# Patient Record
Sex: Male | Born: 1955 | Race: White | Hispanic: No | Marital: Married | Smoking: Former smoker
Health system: Southern US, Community
[De-identification: ages and names within clinical notes are randomized; demographics above are authoritative.]

## PROBLEM LIST (undated history)

## (undated) DIAGNOSIS — J42 Unspecified chronic bronchitis: Secondary | ICD-10-CM

## (undated) DIAGNOSIS — J84112 Idiopathic pulmonary fibrosis: Secondary | ICD-10-CM

## (undated) DIAGNOSIS — I209 Angina pectoris, unspecified: Secondary | ICD-10-CM

## (undated) DIAGNOSIS — M5136 Other intervertebral disc degeneration, lumbar region: Secondary | ICD-10-CM

## (undated) DIAGNOSIS — R7989 Other specified abnormal findings of blood chemistry: Secondary | ICD-10-CM

## (undated) DIAGNOSIS — Z789 Other specified health status: Secondary | ICD-10-CM

## (undated) DIAGNOSIS — I10 Essential (primary) hypertension: Secondary | ICD-10-CM

## (undated) DIAGNOSIS — K219 Gastro-esophageal reflux disease without esophagitis: Secondary | ICD-10-CM

## (undated) DIAGNOSIS — R778 Other specified abnormalities of plasma proteins: Secondary | ICD-10-CM

## (undated) DIAGNOSIS — M47816 Spondylosis without myelopathy or radiculopathy, lumbar region: Secondary | ICD-10-CM

## (undated) DIAGNOSIS — G4730 Sleep apnea, unspecified: Secondary | ICD-10-CM

## (undated) DIAGNOSIS — M792 Neuralgia and neuritis, unspecified: Secondary | ICD-10-CM

## (undated) DIAGNOSIS — G4700 Insomnia, unspecified: Secondary | ICD-10-CM

## (undated) DIAGNOSIS — R05 Cough: Secondary | ICD-10-CM

## (undated) DIAGNOSIS — M5442 Lumbago with sciatica, left side: Secondary | ICD-10-CM

## (undated) DIAGNOSIS — E785 Hyperlipidemia, unspecified: Secondary | ICD-10-CM

## (undated) DIAGNOSIS — I639 Cerebral infarction, unspecified: Secondary | ICD-10-CM

## (undated) DIAGNOSIS — E6609 Other obesity due to excess calories: Secondary | ICD-10-CM

## (undated) DIAGNOSIS — R079 Chest pain, unspecified: Secondary | ICD-10-CM

## (undated) DIAGNOSIS — R053 Chronic cough: Secondary | ICD-10-CM

## (undated) HISTORY — DX: Essential (primary) hypertension: I10

## (undated) HISTORY — DX: Other specified abnormalities of plasma proteins: R77.8

## (undated) HISTORY — DX: Other obesity due to excess calories: E66.09

## (undated) HISTORY — DX: Idiopathic pulmonary fibrosis: J84.112

## (undated) HISTORY — DX: Lumbago with sciatica, left side: M54.42

## (undated) HISTORY — DX: Cerebral infarction, unspecified: I63.9

## (undated) HISTORY — DX: Cough: R05

## (undated) HISTORY — DX: Chronic cough: R05.3

## (undated) HISTORY — DX: Sleep apnea, unspecified: G47.30

## (undated) HISTORY — DX: Unspecified chronic bronchitis: J42

## (undated) HISTORY — DX: Insomnia, unspecified: G47.00

## (undated) HISTORY — DX: Neuralgia and neuritis, unspecified: M79.2

## (undated) HISTORY — DX: Other intervertebral disc degeneration, lumbar region: M51.36

## (undated) HISTORY — PX: OTHER SURGICAL HISTORY: SHX169

## (undated) HISTORY — DX: Chest pain, unspecified: R07.9

## (undated) HISTORY — DX: Gastro-esophageal reflux disease without esophagitis: K21.9

## (undated) HISTORY — DX: Other specified abnormal findings of blood chemistry: R79.89

## (undated) HISTORY — DX: Spondylosis without myelopathy or radiculopathy, lumbar region: M47.816

## (undated) HISTORY — DX: Angina pectoris, unspecified: I20.9

## (undated) HISTORY — DX: Hyperlipidemia, unspecified: E78.5

## (undated) HISTORY — DX: Other specified health status: Z78.9

---

## 2017-07-22 ENCOUNTER — Ambulatory Visit (INDEPENDENT_AMBULATORY_CARE_PROVIDER_SITE_OTHER): Payer: Medicare HMO | Admitting: Internal Medicine

## 2017-07-22 ENCOUNTER — Other Ambulatory Visit: Payer: Medicare HMO

## 2017-07-22 ENCOUNTER — Encounter: Payer: Self-pay | Admitting: Internal Medicine

## 2017-07-22 VITALS — BP 130/70 | HR 82 | Ht 70.0 in | Wt 226.4 lb

## 2017-07-22 DIAGNOSIS — Z79899 Other long term (current) drug therapy: Secondary | ICD-10-CM

## 2017-07-22 DIAGNOSIS — J849 Interstitial pulmonary disease, unspecified: Secondary | ICD-10-CM | POA: Diagnosis not present

## 2017-07-22 DIAGNOSIS — Z01811 Encounter for preprocedural respiratory examination: Secondary | ICD-10-CM | POA: Insufficient documentation

## 2017-07-22 NOTE — Progress Notes (Signed)
   Subjective:    Patient ID: Chase Kim, male    DOB: 09-14-1956, 61 y.o.   MRN: [id number]  HPI    Review of Systems  Constitutional: Positive for unexpected weight change. Negative for fever.  HENT: Positive for ear discharge. Negative for congestion, dental problem, nosebleeds, postnasal drip, rhinorrhea, sinus pressure, sneezing, sore throat and trouble swallowing.   Eyes: Negative for redness and itching.  Respiratory: Positive for cough, chest tightness, shortness of breath and wheezing.   Cardiovascular: Positive for palpitations and leg swelling.  Gastrointestinal: Negative for nausea and vomiting.  Genitourinary: Negative for dysuria.  Musculoskeletal: Negative for joint swelling.  Skin: Negative for rash.  Allergic/Immunologic: Negative.  Negative for environmental allergies, food allergies and immunocompromised state.  Neurological: Negative for headaches.  Hematological: Bruises/bleeds easily.  Psychiatric/Behavioral: Positive for dysphoric mood. The patient is nervous/anxious.        Objective:   Physical Exam        Assessment & Plan:

## 2017-07-22 NOTE — Progress Notes (Addendum)
Subjective:     Patient ID: Chase Kim, male   DOB: October 08, 1955, 61 y.o.   MRN: [id number] PCP Charlott Rakes, MD  HPI   IOV 07/22/2017  Chief Complaint  Patient presents with  . Advice Only    Referred by Marzetta Board from [place] for interstitial pulmonary fibrosis.  C/o prod. cough with white to dark brown with bloody mucus, SOB on exertion, and CP. 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 He tells me that he said insidious onset of cough for the last few years particularly worse in the last 1 year. Approximately 1 year ago he ended up getting a second vaccine of Prevnar 23 at [facility]. Apparently after this the cough started getting worse since then it is been progressive also associated with progressive shortness of breath. The cough was getting worse despitestopping ace inhibitors. Then in February2018 he had the CT scan of the chest.  cT scan of the chest without contrast done at [facility]: First of all it is unclear to me if this is a high-resolution CT scan of the chest. The report does not talk about UIP or possible UIP.the CT scan talks about "subpleural reticular opacities and scattered mild subpleural cystic changes throughout both lungs favored to represent chronic fibrosis. In additionthis reticular linear opacities involving the anterior portions of the lower lobes and lingula  And lateral left upper lobe favored to represent chronic scarring, atelectasis or fibrosis." ,. Review of the [facility] notes indicate that on 11/29/2016 Dr Kathi Ludwig did notice the presence of emphysema on the above changes and also the absence of honeycombing and use the CT scan to diagnose IPF. HIs noted July 2018 specifically states that he felt patient had bilateral  subpleural reticulation without groundglass opacitiesdistributed in  all the lobes. He also thought there was some subpleural honeycomb changing. He felt the CT scan was classic for UIP.By this time patient had seen rheumatologist as I confirmed that on the [facility] notes A diagnosis of IPF was established. Patient was started on Pirfenidone (Esbriet). He completed 8 weeks of this at this point and he is tolerating it fine. LAst LFT Sep 2018   His pulmonary function test on 06/18/2017 shows FVC 3.77 L/79.6% predicted, total lung capacity of 5.34 L/76.3% predicted and DLCO 13.15/38.4% predicted[uncorrected DLCO for hemoglobin]  Walking desaturation test 185 feet 3 laps on room air today: Resting pulse ox 99%. Final pulse ox 94%. Resting heart rate 81/m. Final heart rate 108/m   Autoimmune lab work shows 03/03/2017 CCP less than 5but his rheumatoid factor on the same day was elevated at 80  Other lab work shows an elevation in creatinine on 05/31/2007 1.4 mg percent but is in June it was 1.25 mg percent. Liver function test normal as of September 2018 He is also little bit anemic with 05/30/2017 hemoglobin 12.8 g percent which is slightly down compared to 13.4 g percent in May 2017.nuclear medicine cardiac stress test shows ejection fraction 52% with normal perfusion [facility] May    He is here with his grandson both of them extremely worried about the cough and life expectancy. Asking about research trials.in particular they've done some research and stem cell trials. We discussed this extensively.   Also gives a history of sleep apnea diagnosed several years  ago but insurance did not cover his CPAP she is not using it. He tells me that he does have nocturnal desaturations as tested by his wife at home  [employer] of chest physicians interstitial lung disease questionnaire :: filled and returned the following day and entered by me on 08/01/2017  - Symptoms: cough - moste days +, severe , night +, mucus +, hemotpysis +, x 1.5 years.  Dyspnea - clas 3 - Pmhx - heard dz, stroke,   - ROS: GERD +, dry skin +, arthralgia + - Personal exposure: did used street drugs. Smoked cig age [age], 2ppd - Fam hx: COPD +, rest negative - Home - > 39 years old, There is water damage, mold, animals - bids - Occupation:  Worked for [employer],. X 16.5 years. Dust +, Insulation +, Smoke +, Crawlspace_. Also did work in [employer], Advertising account planner, Marine scientist, asbestos, potery, dtergent, Psychologist, forensic, brakes, malt, - Pneumptox: negative   has a past medical history of Angina pectoris ([place]); Bilateral low back pain with left-sided sciatica; Bronchitis, chronic ([place]); Chest pain; Chronic bronchitis ([place]); Chronic cough; DDD (degenerative disc disease), lumbar; Elevated troponin; GERD (gastroesophageal reflux disease); Hyperlipidemia; Hypertension; Insomnia; IPF (idiopathic pulmonary fibrosis) ([employer]); Lumbar facet arthropathy; Neuropathic pain; Non morbid obesity due to excess calories; Poor tolerance for activity; Sleep apnea; and Stroke ([place]).   reports that he quit smoking about 15 years ago. His smoking use included Cigarettes. He has a 54.00 pack-year smoking history. He has never used smokeless tobacco.  Past Surgical History:  Procedure Laterality Date  . triple bypass surgery      Allergies  Allergen Reactions  . Penicillins Swelling  . Benadryl [Diphenhydramine] Hives     There is no immunization history on file for this patient.  Family History  Problem Relation Age of Onset  . Alzheimer's disease Mother   . Leukemia Father   . Lung cancer Sister      Current Outpatient Prescriptions:  .  albuterol (PROVENTIL HFA;VENTOLIN HFA) 108 (90 Base) MCG/ACT inhaler, Inhale into the lungs., Disp: , Rfl:  .  cyclobenzaprine (FLEXERIL) 10 MG tablet, Take by mouth., Disp: , Rfl:  .  esomeprazole (NEXIUM) 40 MG capsule, Take 40 mg by mouth., Disp: , Rfl:  .  ketoconazole (NIZORAL) 2 % shampoo, Apply topically., Disp: , Rfl:   .  Pirfenidone 267 MG CAPS, Take 801 mg by mouth., Disp: , Rfl:  .  ranolazine (RANEXA) 500 MG 12 hr tablet, Take 500 mg by mouth., Disp: , Rfl:  .  rosuvastatin (CRESTOR) 20 MG tablet, Take 20 mg by mouth., Disp: , Rfl:  .  aspirin EC 325 MG tablet, Take 325 mg by mouth., Disp: , Rfl:  .  benzonatate (TESSALON) 100 MG capsule, TAKE 1 CAPSULE BY MOUTH EVERY 8 HOURS, Disp: , Rfl: 0 .  budesonide-formoterol (SYMBICORT) 80-4.5 MCG/ACT inhaler, Inhale into the lungs., Disp: , Rfl:  .  clopidogrel (PLAVIX) 75 MG tablet, clopidogrel 75 mg tablet, Disp: , Rfl:  .  furosemide (LASIX) 40 MG tablet, , Disp: , Rfl:  .  gabapentin (NEURONTIN) 400 MG capsule, , Disp: , Rfl:  .  HYDROcodone-acetaminophen (NORCO/VICODIN) 5-325 MG tablet, hydrocodone 5 mg-acetaminophen 325 mg tablet, Disp: , Rfl:  .  isosorbide mononitrate (IMDUR) 30 MG 24 hr tablet, , Disp: , Rfl:  .  metoprolol succinate (TOPROL-XL) 50 MG 24 hr tablet, , Disp: , Rfl:  .  nitroGLYCERIN (NITROSTAT) 0.4 MG SL tablet, , Disp: ,  Rfl:  .  Pirfenidone (ESBRIET) 267 MG TABS, Esbriet 267 mg tablet, Disp: , Rfl:  .  polyethylene glycol (MIRALAX / GLYCOLAX) packet, Take 17 g by mouth., Disp: , Rfl:  .  traZODone (DESYREL) 100 MG tablet, Take 100 mg by mouth., Disp: , Rfl:  .  zolpidem (AMBIEN) 10 MG tablet, zolpidem 10 mg tablet, Disp: , Rfl:     Review of Systems     Objective:   Physical Exam  Constitutional: He is oriented to person, place, and time. He appears well-developed and well-nourished. No distress.  HENT:  Head: Normocephalic and atraumatic.  Right Ear: External ear normal.  Left Ear: External ear normal.  Mouth/Throat: Oropharynx is clear and moist. No oropharyngeal exudate.  Eyes: Pupils are equal, round, and reactive to light. Conjunctivae and EOM are normal. Right eye exhibits no discharge. Left eye exhibits no discharge. No scleral icterus.  Neck: Normal range of motion. Neck supple. No JVD present. No tracheal deviation  present. No thyromegaly present.  Cardiovascular: Normal rate, regular rhythm and intact distal pulses.  Exam reveals no gallop and no friction rub.   No murmur heard. Pulmonary/Chest: Effort normal. No respiratory distress. He has no wheezes. He has rales. He exhibits no tenderness.  Mild bilateral crackles present hard to hear  Abdominal: Soft. Bowel sounds are normal. He exhibits no distension and no mass. There is no tenderness. There is no rebound and no guarding.  Visceral  obesity  Musculoskeletal: Normal range of motion. He exhibits no edema or tenderness.  Clubbing present  Lymphadenopathy:    He has no cervical adenopathy.  Neurological: He is alert and oriented to person, place, and time. He has normal reflexes. No cranial nerve deficit. Coordination normal.  Skin: Skin is warm and dry. No rash noted. He is not diaphoretic. No erythema. No pallor.  Psychiatric: He has a normal mood and affect. His behavior is normal. Judgment and thought content normal.  Nursing note and vitals reviewed.  Vitals:   07/22/17 1633  BP: 130/70  Pulse: 82  SpO2: 92%  Weight: 226 lb 6.4 oz (102.7 kg)  Height: '5\' 10"'  (1.778 m)    Estimated body mass index is 32.49 kg/m as calculated from the following:   Height as of this encounter: '5\' 10"'  (1.778 m).   Weight as of this encounter: 226 lb 6.4 oz (102.7 kg).     Assessment:       ICD-10-CM   1. ILD (interstitial lung disease) (HCC) J84.9 Hepatic function panel    Sed Rate (ESR)    Angiotensin converting enzyme    Antinuclear Antib (ANA)    Anti-DNA antibody, double-stranded    Rheumatoid factor    Cyclic citrul peptide antibody, IgG    Sjogren's syndrome antibods(ssa + ssb)    Anti-scleroderma antibody    ANCA Screen Reflex Titer    Mpo/pr-3 (anca) antibodies    CK Total (and CKMB)    RNP Antibodies    Aldolase    Hypersensitivity pnuemonitis profile    Pulse oximetry, overnight  2. Encounter for drug therapy Z79.899   3.  Interstitial pulmonary disease (HCC) J84.9 CT Chest High Resolution      Plan:      We discussed   Re IPF - Course   - progressive disease in almost all patients (> 90%); typically few to several year progression   - super unlukcy 10% - progress to death in a year   - super lucky < 10% -  stability > 5 years or even rarely 10 years   -unpredictable in each individual - Rx:  anti-fibrotics + since 2014 but they can only slow disease down; preventative. Not Rx symptoms  - they have side effects including intolerance is < 1/3rd patients  - most 55-65% patients tolerate them well  - further details below - Other pillars of management  - Symptoms - cough and dyspnea RX -> will address at followu  - O2 - definitely helps symptoms -. Test for ONO  - Rehab - definitely helps symptoms and conditioning -> will address more at fo  -  Pulmonary Trials: highly encourage participation through [employer]: discussed concept of trials and discussed stem cell trials in this contex  - Patient Support Group  - to be disussed in future   - http://[place].com/ \   - https://smith-[place].net/   Anti-fibrotic Drugs Both drugs OFEV and Esbriet only slow down progression, 1 out of 6 patients  - this means extension in quality of life but no difference in symptoms  - no study directly compares the 2 drugs but efficacy roughly equal at 1 year time point  - ESBRIET  - 3 pill three times daily, slow titration.  - Need to wean sunscreen  - Some chance of nausea and anorexia with small chance for diarrhea  - no known heart attack risk - no known bleeding risk,   - need monthly blood work for 6 months - monitor liver function - possible mortality benefit in pooled analysis  - larger world wide experience   > 50% of this > 40 min visit spent in face to face counseling or/and  coordination of care  At followup discuss HP exposure  Dr. Brand Males, M.D., [facility].C.P Pulmonary and Critical Care Medicine Staff Physician [place] [facility] Pager: [phone], If no answer or between  15:00h - 7:00h: call [phone]  07/22/2017 5:46 PM

## 2017-07-22 NOTE — Patient Instructions (Addendum)
ICD-10-CM   1. ILD (interstitial lung disease) (HCC) J84.9     Do Serum: ESR, ACE, ANA, DS-DNA, RF, anti-CCP, ssA, ssB, scl-70, ANCA screen, MPO, PR-3, Total CK,  RNP, Aldolase,  Hypersensitivity Pneumonitis Panel and LFT  Do HRCT  Do ONO test on room air  Do ACCP ILD Questionnaire - you can take it home and drop it off sometime or bring it next visit  Followup  - next few weeks but after completing above; we will discuss ILD in more detail esp cough

## 2017-07-23 ENCOUNTER — Other Ambulatory Visit (INDEPENDENT_AMBULATORY_CARE_PROVIDER_SITE_OTHER): Payer: Medicare HMO

## 2017-07-23 ENCOUNTER — Telehealth: Payer: Self-pay | Admitting: Internal Medicine

## 2017-07-23 DIAGNOSIS — J849 Interstitial pulmonary disease, unspecified: Secondary | ICD-10-CM | POA: Diagnosis not present

## 2017-07-23 LAB — HEPATIC FUNCTION PANEL
ALK PHOS: 51 U/L (ref 39–117)
ALT: 13 U/L (ref 0–53)
AST: 16 U/L (ref 0–37)
Albumin: 3.6 g/dL (ref 3.5–5.2)
BILIRUBIN TOTAL: 0.5 mg/dL (ref 0.2–1.2)
Bilirubin, Direct: 0.1 mg/dL (ref 0.0–0.3)
Total Protein: 6.5 g/dL (ref 6.0–8.3)

## 2017-07-23 LAB — SEDIMENTATION RATE: Sed Rate: 48 mm/hr — ABNORMAL HIGH (ref 0–20)

## 2017-07-23 NOTE — Telephone Encounter (Signed)
MR look out for the questionnaire.

## 2017-07-26 ENCOUNTER — Encounter: Payer: Self-pay | Admitting: Internal Medicine

## 2017-07-28 ENCOUNTER — Ambulatory Visit (INDEPENDENT_AMBULATORY_CARE_PROVIDER_SITE_OTHER)
Admission: RE | Admit: 2017-07-28 | Discharge: 2017-07-28 | Disposition: A | Discharge status: Home / Self Care | Payer: Medicare HMO | Source: Ambulatory Visit | Attending: Internal Medicine | Admitting: Internal Medicine

## 2017-07-28 DIAGNOSIS — J849 Interstitial pulmonary disease, unspecified: Secondary | ICD-10-CM

## 2017-07-28 NOTE — Telephone Encounter (Signed)
Questionnaire has been placed on MR's COW. Will route to MR and Irving Burtonmily to f/u on. Thanks.

## 2017-07-28 NOTE — Telephone Encounter (Signed)
Ok as of 07/28/2017 1:50 PM I do not see it on my desk In my office. Where is it? Please leave on my computer on wheels ASAP  Thanks  Dr. Kalman ShanMurali Eulis Salazar, M.D., [facility].C.P Pulmonary and Critical Care Medicine Staff Physician [facility] Pager: [phone], If no answer or between  15:00h - 7:00h: call [phone]  07/28/2017 1:50 PM

## 2017-08-01 NOTE — Telephone Encounter (Signed)
At followup will discuss severa HP type exposures like birds, mold, malt he mentioned in accp ild question  Dr. Kalman ShanMurali Ephram Kornegay, M.D., [facility].C.P Pulmonary and Critical Care Medicine Staff Physician [facility] [place] [facility] Pager: [phone], If no answer or between  15:00h - 7:00h: call [phone]  08/01/2017 2:29 PM

## 2017-08-06 ENCOUNTER — Ambulatory Visit (INDEPENDENT_AMBULATORY_CARE_PROVIDER_SITE_OTHER)
Admission: RE | Admit: 2017-08-06 | Discharge: 2017-08-06 | Disposition: A | Discharge status: Home / Self Care | Payer: Medicare HMO | Source: Ambulatory Visit | Attending: Pulmonary Disease | Admitting: Pulmonary Disease

## 2017-08-06 ENCOUNTER — Ambulatory Visit: Payer: Medicare HMO | Admitting: Pulmonary Disease

## 2017-08-06 ENCOUNTER — Other Ambulatory Visit (INDEPENDENT_AMBULATORY_CARE_PROVIDER_SITE_OTHER): Payer: Medicare HMO

## 2017-08-06 ENCOUNTER — Encounter: Payer: Self-pay | Admitting: Pulmonary Disease

## 2017-08-06 VITALS — BP 126/82 | HR 99 | Ht 70.5 in | Wt 218.2 lb

## 2017-08-06 DIAGNOSIS — R05 Cough: Secondary | ICD-10-CM

## 2017-08-06 DIAGNOSIS — J849 Interstitial pulmonary disease, unspecified: Secondary | ICD-10-CM

## 2017-08-06 DIAGNOSIS — R059 Cough, unspecified: Secondary | ICD-10-CM

## 2017-08-06 LAB — CBC WITH DIFFERENTIAL/PLATELET
BASOS PCT: 0.7 % (ref 0.0–3.0)
Basophils Absolute: 0.1 10*3/uL (ref 0.0–0.1)
Eosinophils Absolute: 0 10*3/uL (ref 0.0–0.7)
Eosinophils Relative: 0.2 % (ref 0.0–5.0)
HEMATOCRIT: 43.3 % (ref 39.0–52.0)
Hemoglobin: 14.4 g/dL (ref 13.0–17.0)
LYMPHS ABS: 2.7 10*3/uL (ref 0.7–4.0)
LYMPHS PCT: 28.8 % (ref 12.0–46.0)
MCHC: 33.4 g/dL (ref 30.0–36.0)
MCV: 90.6 fl (ref 78.0–100.0)
MONOS PCT: 14.6 % — AB (ref 3.0–12.0)
Monocytes Absolute: 1.4 10*3/uL — ABNORMAL HIGH (ref 0.1–1.0)
NEUTROS ABS: 5.2 10*3/uL (ref 1.4–7.7)
NEUTROS PCT: 55.7 % (ref 43.0–77.0)
PLATELETS: 258 10*3/uL (ref 150.0–400.0)
RBC: 4.78 Mil/uL (ref 4.22–5.81)
RDW: 14 % (ref 11.5–15.5)
WBC: 9.4 10*3/uL (ref 4.0–10.5)

## 2017-08-06 MED ORDER — LEVOFLOXACIN 750 MG PO TABS: 750.0000 mg | ORAL_TABLET | Freq: Every day | ORAL | 0 refills | Status: DC

## 2017-08-06 NOTE — Patient Instructions (Addendum)
We will get a chest x-ray today Start you on Levaquin 750 mg a day for 7 days We will give you a prednisone taper starting at 40 mg.  Reduce dose by 10 mg every 3 days Continue using the Tessalon, Hycodan cough syrup and Delsym  For your pulmonary fibrosis We will check a hypersensitivity panel CBC with diff Urine legionella Ag, pneumococcus Ag Sputum culture  Follow-up with Dr. Marchelle Gearingamaswamy

## 2017-08-06 NOTE — Progress Notes (Signed)
Chase Kim    [id number]    [birth year]  Primary Care Physician:Ragsdale, Merlene Laughter, MD  Referring Physician: Epimenio Sarin, MD [address] [place], [place] [address]  Chief complaint: Acute visit for dyspnea  HPI: 60 year old with history of IPF, previously followed at [facility] and on [place]. Seen in consultation at [facility] on 10/23 by Dr. Marchelle Gearing He has complaints of increasing cough with dyspnea, fever for the past 1 week.  He developed diarrhea and nausea a few days ago.  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 Exposures: Has significant issues with mold from a roof leak for the past 2 years. No jacuzzi, hot tubs. Smoking history: 55-pack-year.  Quit in 2003 Travel History: Not significant  Outpatient Encounter Medications as of 08/06/2017  Medication Sig  . albuterol (PROVENTIL HFA;VENTOLIN HFA) 108 (90 Base) MCG/ACT inhaler Inhale into the lungs.  Marland Kitchen aspirin EC 325 MG tablet Take 325 mg by mouth.  . benzonatate (TESSALON) 100 MG capsule TAKE 1 CAPSULE BY MOUTH EVERY 8 HOURS  . budesonide-formoterol (SYMBICORT) 80-4.5 MCG/ACT inhaler Inhale into the lungs.  . clopidogrel (PLAVIX) 75 MG tablet clopidogrel 75 mg tablet  . cyclobenzaprine (FLEXERIL) 10 MG tablet Take by mouth.  . esomeprazole (NEXIUM) 40 MG capsule Take 40 mg by mouth.  . furosemide (LASIX) 40 MG tablet   . gabapentin (NEURONTIN) 400 MG capsule   . HYDROcodone-acetaminophen (NORCO/VICODIN) 5-325 MG tablet hydrocodone 5 mg-acetaminophen 325 mg tablet  . isosorbide mononitrate (IMDUR) 30 MG 24 hr tablet   . ketoconazole (NIZORAL) 2 % shampoo Apply topically.  . metoprolol succinate (TOPROL-XL) 50 MG 24 hr tablet   . nitroGLYCERIN (NITROSTAT) 0.4 MG SL tablet   . Pirfenidone (ESBRIET) 267 MG TABS Esbriet 267 mg tablet  . Pirfenidone 267 MG CAPS Take 801 mg by mouth.   . polyethylene glycol (MIRALAX / GLYCOLAX) packet Take 17 g by mouth.  . ranolazine (RANEXA) 500 MG 12 hr tablet Take 500 mg by mouth.  . rosuvastatin (CRESTOR) 20 MG tablet Take 20 mg by mouth.  . traZODone (DESYREL) 100 MG tablet Take 100 mg by mouth.  . zolpidem (AMBIEN) 10 MG tablet zolpidem 10 mg tablet   No facility-administered encounter medications on file as of 08/06/2017.     Allergies as of 08/06/2017 - Review Complete 07/22/2017  Allergen Reaction Noted  . Penicillins Swelling 07/22/2017  . Benadryl [diphenhydramine] Hives 07/22/2017    Past Medical History:  Diagnosis Date  . Angina pectoris (HCC)   . Bilateral low back pain with left-sided sciatica   . Bronchitis, chronic (HCC)   . Chest pain   . Chronic bronchitis (HCC)   . Chronic cough   . DDD (degenerative disc disease), lumbar   . Elevated troponin   . GERD (gastroesophageal reflux disease)   . Hyperlipidemia   . Hypertension   . Insomnia   . IPF (idiopathic pulmonary fibrosis) (HCC)   . Lumbar facet arthropathy   . Neuropathic pain   . Non morbid obesity due to excess calories   . Poor tolerance for activity   . Sleep apnea   . Stroke [facility])     Past Surgical History:  Procedure Laterality Date  . triple bypass surgery      Family History  Problem Relation Age of Onset  . Alzheimer's disease Mother   . Leukemia Father   .  Lung cancer Sister     Social History   Socioeconomic History  . Marital status: Married    Spouse name: Not on file  . Number of children: Not on file  . Years of education: Not on file  . Highest education level: Not on file  Social Needs  . Financial resource strain: Not on file  . Food insecurity - worry: Not on file  . Food insecurity - inability: Not on file  . Transportation needs - medical: Not on file  . Transportation needs - non-medical: Not on file  Occupational History  . Not on file  Tobacco Use  . Smoking status: Former Smoker    Packs/day: 2.00     Years: 27.00    Pack years: 54.00    Types: Cigarettes    Last attempt to quit: 2003    Years since quitting: 15.8  . Smokeless tobacco: Never Used  Substance and Sexual Activity  . Alcohol use: Not on file  . Drug use: Not on file  . Sexual activity: Not on file  Other Topics Concern  . Not on file  Social History Narrative  . Not on file    Review of systems: Review of Systems  Constitutional: Negative for fever and chills.  HENT: Negative.   Eyes: Negative for blurred vision.  Respiratory: as per HPI  Cardiovascular: Negative for chest pain and palpitations.  Gastrointestinal: Negative for vomiting, diarrhea, blood per rectum. Genitourinary: Negative for dysuria, urgency, frequency and hematuria.  Musculoskeletal: Negative for myalgias, back pain and joint pain.  Skin: Negative for itching and rash.  Neurological: Negative for dizziness, tremors, focal weakness, seizures and loss of consciousness.  Endo/Heme/Allergies: Negative for environmental allergies.  Psychiatric/Behavioral: Negative for depression, suicidal ideas and hallucinations.  All other systems reviewed and are negative.  Physical Exam: Blood pressure 126/82, pulse 99, height 5' 10.5" (1.791 m), weight 218 lb 3.2 oz (99 kg), SpO2 91 %. Gen:      No acute distress HEENT:  EOMI, sclera anicteric Neck:     No masses; no thyromegaly Lungs:    Basal crackles bilaterally L > R; normal respiratory effort CV:         Regular rate and rhythm; no murmurs Abd:      + bowel sounds; soft, non-tender; no palpable masses, no distension Ext:    No edema; adequate peripheral perfusion Skin:      Warm and dry; no rash Neuro: alert and oriented x 3 Psych: normal mood and affect  Data Reviewed: High-resolution CT chest 07/28/17-emphysema, subpleural reticulation, groundglass, traction bronchiectasis mild basal gradient.  No definite honeycombing. Possible UIP Reviewed all images personally  ILD serology  07/23/17-negative except for rheumatoid factor 119 Hypersensitivity pneumonitis panel-pending Flu test 08/06/17- negative  Assessment:  Acute visit for dyspnea We will treat for acute respiratory infection. POC Flu test is negative today. Check Chest x-ray, Prescribe Levaquin for 7 days and prednisone taper Given his GI symptoms will evaluate for Legionella with urine test. Check CBC with differential and sputum culture  ILD, IPF Currently on Esbriet. I doubt that Esbriet causing his GI symptoms as he has been on it for the past 4 months without any issue and has stable LFTs He has significant mold exposure from water damage. HP panel from last visit is still pending.  Labs noted for elevation in rheumatoid factor.  He has been evaluated by rheumatology in the past. Given his persistent joint symptoms he may need a repeat evaluation.  Follow-up with Dr. Marchelle Gearingamaswamy 2 weeks.   Plan/Recommendations: - Chest x-ray, Levaquin - Pred taper starting at 40 mg. Reduce dose by 10 mg every 3 days - CBC, sputum culture, urine Legionella test - Follow hypersensitivity panel  Chilton GreathousePraveen Emily Forse MD [place] [facility] Pager [phone] 08/06/2017, 12:05 PM  CC: Epimenio Sarinagsdale, Kimberly R, MD

## 2017-08-07 ENCOUNTER — Other Ambulatory Visit: Payer: Medicare HMO

## 2017-08-07 ENCOUNTER — Telehealth: Payer: Self-pay | Admitting: Pulmonary Disease

## 2017-08-07 DIAGNOSIS — R05 Cough: Secondary | ICD-10-CM

## 2017-08-07 DIAGNOSIS — R059 Cough, unspecified: Secondary | ICD-10-CM

## 2017-08-07 LAB — POCT INFLUENZA A/B
Influenza A, POC: NEGATIVE
Influenza B, POC: NEGATIVE

## 2017-08-07 MED ORDER — PREDNISONE 10 MG PO TABS: ORAL_TABLET | ORAL | 0 refills | Status: DC

## 2017-08-07 NOTE — Telephone Encounter (Signed)
Spoke with pt in the lobby. He saw PM today and was under the impression that he was to have a prednisone prescription. Per PM's AVS instructions, pt is to have a prednisone taper and Levaquin. Levaquin was sent in but the prednisone wasn't. Pt is fine with having this sent in electronically. This has been taken care of. I apologize to the pt for any inconvenience. Nothing further was needed.

## 2017-08-08 ENCOUNTER — Telehealth: Payer: Self-pay | Admitting: Pulmonary Disease

## 2017-08-08 NOTE — Telephone Encounter (Signed)
Called and spoke with pt and he is aware that he will need to stay on the abx until it is completed.  Pt voiced his understanding.

## 2017-08-09 LAB — LEGIONELLA ANTIGEN, URINE
LEGIONELLA ANTIGEN, URINE: NOT DETECTED
MICRO NUMBER:: 81252675
SPECIMEN QUALITY:: ADEQUATE

## 2017-08-09 LAB — STREP PNEUMONIAE URINARY ANTIGEN: Strep Pneumo Urinary Antigen: NOT DETECTED

## 2017-08-11 LAB — FUNGUS CULTURE W SMEAR

## 2017-08-11 LAB — RESPIRATORY CULTURE OR RESPIRATORY AND SPUTUM CULTURE
MICRO NUMBER:: 81258977
RESULT: NORMAL
SPECIMEN QUALITY: ADEQUATE

## 2017-08-12 ENCOUNTER — Other Ambulatory Visit: Payer: Self-pay

## 2017-08-12 ENCOUNTER — Telehealth: Payer: Self-pay

## 2017-08-12 DIAGNOSIS — J849 Interstitial pulmonary disease, unspecified: Secondary | ICD-10-CM

## 2017-08-12 NOTE — Telephone Encounter (Signed)
Per PM verbally- reorder hypersensitive panel since order was not placed as future.  Order has been placed. Pt is aware and voiced her understanding. Nothing further needed.

## 2017-08-13 ENCOUNTER — Other Ambulatory Visit: Payer: Medicare HMO

## 2017-08-13 DIAGNOSIS — J849 Interstitial pulmonary disease, unspecified: Secondary | ICD-10-CM

## 2017-08-14 LAB — ALLERGY PANEL 11, MOLD GROUP
Aspergillus fumigatus, m3: <0.1 kU/L
CLASS: 0
CLASS: 0
CLASS: 0
Class: 0
Class: 0

## 2017-08-14 LAB — INTERPRETATION:

## 2017-08-14 LAB — IGE: IgE (Immunoglobulin E), Serum: 13 kU/L

## 2017-08-15 LAB — RHEUMATOID FACTOR: Rhuematoid fact SerPl-aCnc: 119 IU/mL — ABNORMAL HIGH (ref <14)

## 2017-08-15 LAB — NICHOLS-CHANTILLY MISCELLANEOUS ORDER: PRICE: 214

## 2017-08-15 LAB — ANTI-SCLERODERMA ANTIBODY: Scleroderma (Scl-70) (ENA) Antibody, IgG: <1 AI

## 2017-08-15 LAB — MPO/PR-3 (ANCA) ANTIBODIES

## 2017-08-15 LAB — ANTI-DNA ANTIBODY, DOUBLE-STRANDED: ds DNA Ab: 2 IU/mL

## 2017-08-15 LAB — ALDOLASE: ALDOLASE: 3.8 U/L (ref <=8.1)

## 2017-08-15 LAB — CK TOTAL AND CKMB (NOT AT ARMC)
CK, MB: <0.7 ng/mL (ref 0–5.0)
Total CK: 61 U/L (ref 44–196)

## 2017-08-15 LAB — ANCA SCREEN W REFLEX TITER: ANCA SCREEN: NEGATIVE

## 2017-08-15 LAB — SJOGREN'S SYNDROME ANTIBODS(SSA + SSB)
SSA (Ro) (ENA) Antibody, IgG: <1 AI
SSB (La) (ENA) Antibody, IgG: <1 AI

## 2017-08-15 LAB — ANA: ANA: NEGATIVE

## 2017-08-15 LAB — ANGIOTENSIN CONVERTING ENZYME: ANGIOTENSIN-CONVERTING ENZYME: 38 U/L (ref 9–67)

## 2017-08-15 LAB — CYCLIC CITRUL PEPTIDE ANTIBODY, IGG

## 2017-08-18 LAB — HYPERSENSITIVITY PNEUMONITIS
A. Pullulans Abs: NEGATIVE
A.Fumigatus #1 Abs: NEGATIVE
Micropolyspora faeni, IgG: NEGATIVE
PIGEON SERUM ABS: NEGATIVE
Thermoact. Saccharii: NEGATIVE
Thermoactinomyces vulgaris, IgG: NEGATIVE

## 2017-08-27 ENCOUNTER — Other Ambulatory Visit (INDEPENDENT_AMBULATORY_CARE_PROVIDER_SITE_OTHER): Payer: Medicare HMO

## 2017-08-27 ENCOUNTER — Ambulatory Visit: Payer: Medicare HMO | Admitting: Internal Medicine

## 2017-08-27 ENCOUNTER — Telehealth: Payer: Self-pay | Admitting: Internal Medicine

## 2017-08-27 ENCOUNTER — Encounter: Payer: Self-pay | Admitting: Internal Medicine

## 2017-08-27 VITALS — BP 134/62 | HR 89 | Ht 70.5 in | Wt 226.6 lb

## 2017-08-27 DIAGNOSIS — Z01811 Encounter for preprocedural respiratory examination: Secondary | ICD-10-CM | POA: Diagnosis not present

## 2017-08-27 DIAGNOSIS — J849 Interstitial pulmonary disease, unspecified: Secondary | ICD-10-CM | POA: Diagnosis not present

## 2017-08-27 DIAGNOSIS — Z77120 Contact with and (suspected) exposure to mold (toxic): Secondary | ICD-10-CM | POA: Diagnosis not present

## 2017-08-27 DIAGNOSIS — Z5181 Encounter for therapeutic drug level monitoring: Secondary | ICD-10-CM | POA: Diagnosis not present

## 2017-08-27 LAB — HEPATIC FUNCTION PANEL
ALBUMIN: 3.6 g/dL (ref 3.5–5.2)
ALK PHOS: 59 U/L (ref 39–117)
ALT: 21 U/L (ref 0–53)
AST: 19 U/L (ref 0–37)
BILIRUBIN DIRECT: 0.1 mg/dL (ref 0.0–0.3)
TOTAL PROTEIN: 6.8 g/dL (ref 6.0–8.3)
Total Bilirubin: 0.7 mg/dL (ref 0.2–1.2)

## 2017-08-27 NOTE — Patient Instructions (Addendum)
ICD-10-CM   1. ILD (interstitial lung disease) (HCC) J84.9 Hepatic function panel    Pulmonary function test    Ambulatory referral to Cardiothoracic Surgery  2. Mold exposure Z77.120   3. Therapeutic drug monitoring Z51.81     Continue esbriet Check lft 08/27/2017 Get rid of mold at home Start ono 2L [place] at night Refer DR Dorris FetchHendrickson for evaluation of surgical lung biopsy - differential is IPF v Hypersensitivity Pneumonitis  Followup - 4- 6 weeks do Pre-bd spiro and dlco only. No lung volume or bd response. No post-bd spiro - REturn to see me in 4-6 weeks but after above

## 2017-08-27 NOTE — Telephone Encounter (Signed)
ono abnormal -pleas start 2L [place] at night - forgot  Also give him info for 09/13/17 meeting - forgot to do that   Dr. Kalman ShanMurali Angelito Hopping, M.D., [facility].P Pulmonary and Critical Care Medicine Staff Physician, [facility] Center Director - Interstitial Lung Disease  Program  Pulmonary Fibrosis [facility] Network at [facility] [place], [place], [id number]  Pager: [phone], If no answer or between  15:00h - 7:00h: call [phone] Telephone: [phone]

## 2017-08-27 NOTE — Telephone Encounter (Signed)
Also let him know that Dr Fredirick LatheBlietz radiologist looked at ct again and declared no honeycombing  ono  < / = 88% for 160.725min on 07/26/17 - 07/27/17  - start O2 today (30d  Limit!!!!)  Dr. Kalman ShanMurali Ann-Marie Kluge, M.D., [facility].C.C.P Pulmonary and Critical Care Medicine Staff Physician, [facility] Center Director - Interstitial Lung Disease  Program  Pulmonary Fibrosis [facility] at [facility] [place], [place], [address]  Pager: [phone], If no answer or between  15:00h - 7:00h: call [phone] Telephone: [phone]

## 2017-08-27 NOTE — Telephone Encounter (Signed)
Called and spoke to pt. Informed him of the results and recs per MR. Order placed. Pt verbalized understanding and denied any further questions or concerns at this time.   

## 2017-08-27 NOTE — Progress Notes (Signed)
Subjective:     Patient ID: Chase Kim, male   DOB: [birth year], 61 y.o.   MRN: [id number]  HPI PCP Charlott Rakes, MD  HPI   IOV 07/22/2017  Chief Complaint  Patient presents with  . Advice Only    Referred by Marzetta Board from [place] for interstitial pulmonary fibrosis.  C/o prod. cough with white to dark brown with bloody mucus, SOB on exertion, and CP. 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 He tells me that he said insidious onset of cough for the last few years particularly worse in the last 1 year. Approximately 1 year ago he ended up getting a second vaccine of Prevnar 23 at [facility]. Apparently after this the cough started getting worse since then it is been progressive also associated with progressive shortness of breath. The cough was getting worse despitestopping ace inhibitors. Then in February2018 he had the CT scan of the chest.  cT scan of the chest without contrast [facility]: First of all it is unclear to me if this is a high-resolution CT scan of the chest. The report does not talk about UIP or possible UIP.the CT scan talks about "subpleural reticular opacities and scattered mild subpleural cystic changes throughout both lungs favored to represent chronic fibrosis. In additionthis reticular linear opacities involving the anterior portions of the lower lobes and lingula  And lateral left upper lobe favored to represent chronic scarring, atelectasis or fibrosis." ,. Review of the [facility] notes indicate that on 11/29/2016 Dr Kathi Ludwig did notice the presence of emphysema on the above changes and also the absence of honeycombing and use the CT scan to diagnose IPF. HIs noted July 2018 specifically states that he felt patient had bilateral  subpleural reticulation without groundglass  opacitiesdistributed in all the lobes. He also thought there was some subpleural honeycomb changing. He felt the CT scan was classic for UIP.By this time patient had seen rheumatologist as I confirmed that on the [facility] notes A diagnosis of IPF was established. Patient was started on Pirfenidone (Esbriet). He completed 8 weeks of this at this point and he is tolerating it fine. LAst LFT Sep 2018   His pulmonary function test on 06/18/2017 shows FVC 3.77 L/79.6% predicted, total lung capacity of 5.34 L/76.3% predicted and DLCO 13.15/38.4% predicted[uncorrected DLCO for hemoglobin]  Walking desaturation test 185 feet 3 laps on room air today: Resting pulse ox 99%. Final pulse ox 94%. Resting heart rate 81/m. Final heart rate 108/m   Autoimmune lab work shows 03/03/2017 CCP less than 5but his rheumatoid factor on the same day was elevated at 80  Other lab work shows an elevation in creatinine on 05/31/2007 1.4 mg percent but is in June it was 1.25 mg percent. Liver function test normal as of September 2018 He is also little bit anemic with 05/30/2017 hemoglobin 12.8 g percent which is slightly down compared to 13.4 g percent in May 2017.nuclear medicine cardiac stress test shows ejection fraction 52% with normal perfusion [facility] May    He is here with TRavis son in law both of them extremely worried about the cough and life expectancy. Asking about research trials.in particular they've done some research and stem cell trials. We discussed this extensively.   Also gives a history of sleep  apnea diagnosed several years ago but insurance did not cover his CPAP she is not using it. He tells me that he does have nocturnal desaturations as tested by his wife at home  [employer] of chest physicians interstitial lung disease questionnaire :: filled and returned the following day and entered by me on 08/01/2017  - Symptoms: cough - moste days +, severe , night +, mucus +,  hemotpysis +, x 1.5 years. Dyspnea - clas 3 - Pmhx - heard dz, stroke,   - ROS: GERD +, dry skin +, arthralgia + - Personal exposure: did used street drugs. Smoked cig age [age], 2ppd - Fam hx: COPD +, rest negative - Home - > 27 years old, There is water damage, mold, animals - bids - Occupation:  Worked for [employer],. X 16.5 years. Dust +, Insulation +, Smoke +, Crawlspace_. Also did work in [employer], Environmental education officer and Oncologist, Marine scientist, asbestos, potery, dtergent, Psychologist, forensic, brakes, malt, - Pneumptox: negative     Acute visit 08/06/17 61 year old with history of IPF, previously followed at [facility] and on [place]. Seen in consultation at [facility] on 10/23 by Dr. Chase Caller He has complaints of increasing cough with dyspnea, fever for the past 1 week.  He developed diarrhea and nausea a few days ago.  Pets: Has dogs, no birds, farm animals, exotic pets Occupation: Works for [employer] with exposure to dust, installation, smoke, possible asbestos. Exposures: Has significant issues with mold from a roof leak for the past 2 years. No jacuzzi, hot tubs. Smoking history: 55-pack-year.  Quit in 2003 Travel History: Not significant   OV 08/27/2017  Chief Complaint  Patient presents with  . Follow-up    Pt is currently on Esbriet. Still has complaints of a cough with mild SOB with exertion.     FU ILD - clinical dx of IPF at [facility]. Now under care at [facility]. Workup in Progress    Presents with daughter April, Anne and her husband Darnelle Maffucci. Wife not present.  Here to review ILD workup for diagnosis and management. They have lot of questions. Etiologic workup so far: Hx revealed significant mold exposure at  Home x 2 year (they are tryhing to get rid of it). He is also reporting recurrent respiratory exacerbation needing prednisone. Daughter thinks atleast 4 times this  Year. Autoimmune panel (below) negative except RF. VAsculitis panel negative. HP panel  negative HRCT read by Dr Rosario Jacks and I d/;w her via email again today and personally saw CT and agree with findings: Old ATS possible UIP. New ATS: indeterminate v probable -> due to lack of honeycombing and nothing beyond mild traction bronchieclectasis. There is associaed emphysema though  Severity: he seems more hampered by his obesity and pain issues. ONO - 10/27 desatured for 160 min. Last visit did not desaturate with walking  Co morbidity:  He does have gerd under control. Has CAD on plavix but 2017 stress test was normal. He has had stroke before and has mild carotid plaque per hx  Therapy: currently in esbriet since sept 2017 and tolerating well. Does have nocturia but probasbly associated with increased water and coffee in day time   IMPRESSION: HRCT Lungs/Pleura: Paraseptal emphysema. Somewhat basilar predominant pattern of subpleural reticulation, ground-glass and mild traction bronchiolectasis. No definite honeycombing. No air trapping. No pleural fluid. Airway is unremarkable. 1. Pulmonary parenchymal pattern of fibrosis may be due to fibrotic nonspecific interstitial pneumonitis or usual interstitial pneumonitis (ATS criteria =  possible UIP). 2.  Aortic atherosclerosis (ICD10-170.0). 3.  Emphysema (ICD10-J43.9). 4. Cholelithiasis.   Electronically Signed   By: Leanna BattlesMelinda  Blietz M.D.   On: 07/28/2017 15:24   Results for Mertie ClauseLAWSON, Isahia (MRN [id number]) as of 08/27/2017 12:18  Ref. Range 07/23/2017 09:05  Anit Nuclear Antibody(ANA) Latest Ref Range: NEGATIVE  NEGATIVE  ANCA SCREEN Latest Ref Range: Negative  Negative  Angiotensin-Converting Enzyme Latest Ref Range: 9 - 67 U/L 38  Cyclic Citrullin Peptide Ab Latest Units: UNITS <16  ds DNA Ab Latest Units: IU/mL 2  Myeloperoxidase Abs Latest Units: AI <1.0  Serine Protease 3 Latest Units: AI <1.0  RA Latex Turbid. Latest Ref Range: <14 IU/mL 119 (H)  SSA (Ro) (ENA) Antibody, IgG Latest Ref Range: <1.0 NEG AI <1.0 NEG   SSB (La) (ENA) Antibody, IgG Latest Ref Range: <1.0 NEG AI <1.0 NEG  Scleroderma (Scl-70) (ENA) Antibody, IgG Latest Ref Range: <1.0 NEG AI <1.0 NEG  Results for Mertie ClauseLAWSON, Adan (MRN [id number]) as of 08/27/2017 12:18  Ref. Range 08/13/2017 11:21  Candida albicans Latest Units: kU/L <0.10  IgE (Immunoglobulin E), Serum Latest Ref Range: <OR=114 kU/L 13  A.Fumigatus #1 Abs Latest Ref Range: Negative  Negative  Micropolyspora faeni, IgG Latest Ref Range: Negative  Negative  Thermoactinomyces vulgaris, IgG Latest Ref Range: Negative  Negative  A. Pullulans Abs Latest Ref Range: Negative  Negative  Thermoact. Saccharii Latest Ref Range: Negative  Negative  Pigeon Serum Abs Latest Ref Range: Negative  Negative     has a past medical history of Angina pectoris (HCC), Bilateral low back pain with left-sided sciatica, Bronchitis, chronic (HCC), Chest pain, Chronic bronchitis (HCC), Chronic cough, DDD (degenerative disc disease), lumbar, Elevated troponin, GERD (gastroesophageal reflux disease), Hyperlipidemia, Hypertension, Insomnia, IPF (idiopathic pulmonary fibrosis) (HCC), Lumbar facet arthropathy, Neuropathic pain, Non morbid obesity due to excess calories, Poor tolerance for activity, Sleep apnea, and Stroke (HCC).   reports that he quit smoking about 15 years ago. His smoking use included cigarettes. He has a 54.00 pack-year smoking history. he has never used smokeless tobacco.  Past Surgical History:  Procedure Laterality Date  . triple bypass surgery      Allergies  Allergen Reactions  . Penicillins Swelling  . Benadryl [Diphenhydramine] Hives    Immunization History  Administered Date(s) Administered  . Pneumococcal Conjugate-13 10/04/2013  . Pneumococcal Polysaccharide-23 01/31/2016    Family History  Problem Relation Age of Onset  . Alzheimer's disease Mother   . Leukemia Father   . Lung cancer Sister      Current Outpatient Medications:  .  albuterol (PROVENTIL  HFA;VENTOLIN HFA) 108 (90 Base) MCG/ACT inhaler, Inhale into the lungs., Disp: , Rfl:  .  aspirin EC 325 MG tablet, Take 325 mg by mouth., Disp: , Rfl:  .  benzonatate (TESSALON) 100 MG capsule, TAKE 1 CAPSULE BY MOUTH EVERY 8 HOURS, Disp: , Rfl: 0 .  budesonide-formoterol (SYMBICORT) 80-4.5 MCG/ACT inhaler, Inhale into the lungs., Disp: , Rfl:  .  clopidogrel (PLAVIX) 75 MG tablet, clopidogrel 75 mg tablet, Disp: , Rfl:  .  cyclobenzaprine (FLEXERIL) 10 MG tablet, Take by mouth., Disp: , Rfl:  .  esomeprazole (NEXIUM) 40 MG capsule, Take 40 mg by mouth., Disp: , Rfl:  .  furosemide (LASIX) 40 MG tablet, , Disp: , Rfl:  .  gabapentin (NEURONTIN) 400 MG capsule, , Disp: , Rfl:  .  isosorbide mononitrate (IMDUR) 30 MG 24 hr tablet, , Disp: , Rfl:  .  ketoconazole (NIZORAL) 2 %  shampoo, Apply topically., Disp: , Rfl:  .  metoprolol succinate (TOPROL-XL) 50 MG 24 hr tablet, , Disp: , Rfl:  .  nitroGLYCERIN (NITROSTAT) 0.4 MG SL tablet, , Disp: , Rfl:  .  Pirfenidone (ESBRIET) 267 MG TABS, Esbriet 267 mg tablet, Disp: , Rfl:  .  polyethylene glycol (MIRALAX / GLYCOLAX) packet, Take 17 g by mouth., Disp: , Rfl:  .  ranolazine (RANEXA) 500 MG 12 hr tablet, Take 500 mg by mouth., Disp: , Rfl:  .  rosuvastatin (CRESTOR) 20 MG tablet, Take 20 mg by mouth., Disp: , Rfl:  .  traZODone (DESYREL) 100 MG tablet, Take 100 mg by mouth., Disp: , Rfl:  .  zolpidem (AMBIEN) 10 MG tablet, zolpidem 10 mg tablet, Disp: , Rfl:     Review of Systems     Objective:   Physical Exam  Vitals:   08/27/17 1204  BP: 134/62  Pulse: 89  SpO2: 92%  Weight: 226 lb 9.6 oz (102.8 kg)  Height: 5' 10.5" (1.791 m)   Estimated body mass index is 32.05 kg/m as calculated from the following:   Height as of this encounter: 5' 10.5" (1.791 m).   Weight as of this encounter: 226 lb 9.6 oz (102.8 kg).  Visceral obesity Lung crackles +    Assessment:       ICD-10-CM   1. ILD (interstitial lung disease) (HCC) J84.9  Hepatic function panel    Pulmonary function test    Ambulatory referral to Cardiothoracic Surgery  2. Mold exposure Z77.120   3. Therapeutic drug monitoring Z51.81   4. Preoperative respiratory examination Z01.811    ETiology: This could be IPF (> 70% odds) based on male gender, bilateral subpleural disease, GERD, and occupation work. However the mold history is strong and he is < 37 years of age making alterate etiology of chronic HP a real possibility (30% odds). Both are progressive disesases and only vary in rate. For chronic HP : allergern mold exposure removal will be beneficial  Mgmgt: Esbriet not (yet) establised for HP. Rx for HP will be steroids and definitely exposure removal. We discussed him leaving the house but this is not possible. He is working on getting the house rid of mold. We discussed calling it IPF and Rx with esbriet and then for chronic HP doing chronic steroids + mold removal but then there is risk of chronic steroids esp with his obeisty and MSK issues. We discussed surgical lung biopsy as gold standard  to sort these out. They are open to the idea but worried (appropriately) about the risks. He has had prior CABG/stent and has mild carotid stenosis. However he is not on o2 and so risk for surgical VATS lung bx is not prohibitive. We agreed that perhaps he should sit down with Dr Dorris Fetch and evaluate risk before committing  Rx: continue esbriet for now. Check LFT. Also start ONO. And attend [facility] regional patient - MD symposia. Work to get rid of mold   Severity: will reasses in 4-6 weeks with spirometry/dlco     Plan:      Continue esbriet Check lft 08/27/2017 Get rid of mold at home Start ono 2L [place] at night Refer DR Dorris Fetch for evaluation of surgical lung biopsy - differential is IPF v Hypersensitivity Pneumonitis Attend PFF symposia 09/13/17  Followup - 4- 6 weeks do Pre-bd spiro and dlco only. No lung volume or bd response. No  post-bd spiro - REturn to see me in 4-6 weeks  but after above    > 50% of this > 40 min visit spent in face to face counseling or/and coordination of care    Dr. Kalman ShanMurali Jeff Frieden, M.D., [facility].C.C.P Pulmonary and Critical Care Medicine Staff Physician, [facility] Center Director - Interstitial Lung Disease  Program  Pulmonary Fibrosis [facility] Care Center Network at [facility] [place], [place], [address]  Pager: [phone], If no answer or between  15:00h - 7:00h: call [phone] Telephone: [phone]

## 2017-09-16 ENCOUNTER — Other Ambulatory Visit: Payer: Self-pay

## 2017-09-16 ENCOUNTER — Institutional Professional Consult (permissible substitution): Payer: Medicare HMO | Admitting: Thoracic Surgery (Cardiothoracic Vascular Surgery)

## 2017-09-16 ENCOUNTER — Encounter: Payer: Self-pay | Admitting: Thoracic Surgery (Cardiothoracic Vascular Surgery)

## 2017-09-16 VITALS — BP 119/78 | HR 74 | Resp 16 | Ht 70.5 in | Wt 218.0 lb

## 2017-09-16 DIAGNOSIS — Z951 Presence of aortocoronary bypass graft: Secondary | ICD-10-CM

## 2017-09-16 DIAGNOSIS — Z955 Presence of coronary angioplasty implant and graft: Secondary | ICD-10-CM

## 2017-09-16 DIAGNOSIS — J849 Interstitial pulmonary disease, unspecified: Secondary | ICD-10-CM

## 2017-09-16 NOTE — Progress Notes (Signed)
PCP is Epimenio Sarinagsdale, Kimberly R, MD Referring Provider is Kalman Shanamaswamy, Murali, MD  Chief Complaint  Patient presents with  . Interstitial Lung Disease    eval for bx...CT CHEST...PFT is NOT scheduled until 10/28/17!!    HPI: Mr. Chase Kim is a 61 year old gentleman sent for consultation regarding possible lung biopsy.  Mr. Chase Kim is a 61 year old man with a past medical history significant for tobacco abuse (54 pack years, quit in 2003), chronic bronchitis, coronary artery disease with previous CABG in 2013 and stent in 2014, degenerative disc disease, chronic pain, hypertension, hyperlipidemia, stroke, and sleep apnea.  He was in his usual state of health until about 8 months ago when he developed a persistent cough and shortness of breath.  This initially was more prevalent with exertion but progressed to the point where he feels short of breath nearly all the time.  He also complained of hemoptysis on multiple occasions.  He and his family initially thought this might be cardiac in origin but ultimately was determined to have interstitial lung disease.  He was started on pirfenidone for pulmonary fibrosis.  He sought out a second opinion from Dr. Marchelle Gearingamaswamy.  He had concerns about the possibility of this being hypersensitivity pneumonitis rather than UIP.  He brought up the possibility of a lung biopsy for more definitive diagnosis and now Mr. Chase Kim presents to discuss that procedure.  He continues to feel short of breath especially with exertion.  He does continue to have hemoptysis usually in the mornings.  He complains of decreased energy.  He does complain of some right-sided chest pain which he feels is due to the cough as well as some generalized chest tightness that is not strictly exertional.  He has dizzy spells, swelling in his legs, frequent urination, back pain with difficulty walking due to pain in his legs.  He is on Plavix for coronary stent. Past Medical History:  Diagnosis Date  . Angina  pectoris (HCC)   . Bilateral low back pain with left-sided sciatica   . Bronchitis, chronic (HCC)   . Chest pain   . Chronic bronchitis (HCC)   . Chronic cough   . DDD (degenerative disc disease), lumbar   . Elevated troponin   . GERD (gastroesophageal reflux disease)   . Hyperlipidemia   . Hypertension   . Insomnia   . IPF (idiopathic pulmonary fibrosis) (HCC)   . Lumbar facet arthropathy   . Neuropathic pain   . Non morbid obesity due to excess calories   . Poor tolerance for activity   . Sleep apnea   . Stroke [facility](HCC)     Past Surgical History:  Procedure Laterality Date  . triple bypass surgery      Family History  Problem Relation Age of Onset  . Alzheimer's disease Mother   . Leukemia Father   . Lung cancer Sister     Social History Social History   Tobacco Use  . Smoking status: Former Smoker    Packs/day: 2.00    Years: 27.00    Pack years: 54.00    Types: Cigarettes    Last attempt to quit: 2003    Years since quitting: 15.9  . Smokeless tobacco: Never Used  Substance Use Topics  . Alcohol use: Not on file  . Drug use: Not on file    Current Outpatient Medications  Medication Sig Dispense Refill  . albuterol (PROVENTIL HFA;VENTOLIN HFA) 108 (90 Base) MCG/ACT inhaler Inhale into the lungs.    Marland Kitchen. aspirin EC 325  MG tablet Take 325 mg by mouth.    . benzonatate (TESSALON) 100 MG capsule TAKE 1 CAPSULE BY MOUTH EVERY 8 HOURS  0  . budesonide-formoterol (SYMBICORT) 80-4.5 MCG/ACT inhaler Inhale into the lungs.    . clopidogrel (PLAVIX) 75 MG tablet clopidogrel 75 mg tablet    . cyclobenzaprine (FLEXERIL) 10 MG tablet Take by mouth.    . esomeprazole (NEXIUM) 40 MG capsule Take 40 mg by mouth.    . furosemide (LASIX) 40 MG tablet     . gabapentin (NEURONTIN) 400 MG capsule     . isosorbide mononitrate (IMDUR) 30 MG 24 hr tablet     . ketoconazole (NIZORAL) 2 % shampoo Apply topically.    . metoprolol succinate (TOPROL-XL) 50 MG 24 hr tablet     .  nitroGLYCERIN (NITROSTAT) 0.4 MG SL tablet     . Pirfenidone (ESBRIET) 267 MG TABS Esbriet 267 mg tablet    . polyethylene glycol (MIRALAX / GLYCOLAX) packet Take 17 g by mouth.    . ranolazine (RANEXA) 500 MG 12 hr tablet Take 500 mg by mouth.    . rosuvastatin (CRESTOR) 20 MG tablet Take 20 mg by mouth.    . traZODone (DESYREL) 100 MG tablet Take 100 mg by mouth.    . zolpidem (AMBIEN) 10 MG tablet zolpidem 10 mg tablet     No current facility-administered medications for this visit.     Allergies  Allergen Reactions  . Penicillins Swelling  . Benadryl [Diphenhydramine] Hives    Review of Systems  Constitutional: Positive for activity change, fatigue and unexpected weight change (Weight gain). Negative for appetite change.  HENT: Positive for trouble swallowing and voice change. Negative for dental problem.   Eyes: Positive for visual disturbance.  Respiratory: Positive for apnea, cough, chest tightness and shortness of breath.   Cardiovascular: Positive for chest pain and leg swelling. Negative for palpitations.  Gastrointestinal: Negative for abdominal distention and abdominal pain.  Genitourinary: Positive for frequency. Negative for dysuria.  Musculoskeletal: Positive for arthralgias, back pain, joint swelling and myalgias.       Chronic pain  Skin:       Rash  Neurological: Positive for dizziness.       Memory problems, prior stroke  Hematological: Negative for adenopathy. Bruises/bleeds easily.  Psychiatric/Behavioral: The patient is nervous/anxious.   All other systems reviewed and are negative.   BP 119/78 (BP Location: Right Arm, Patient Position: Sitting, Cuff Size: Large)   Pulse 74   Resp 16   Ht 5' 10.5" (1.791 m)   Wt 218 lb (98.9 kg)   SpO2 96% Comment: ON RA  BMI 30.84 kg/m  Physical Exam  Constitutional: He is oriented to person, place, and time. He appears well-developed and well-nourished. No distress.  HENT:  Head: Normocephalic and atraumatic.   Mouth/Throat: No oropharyngeal exudate.  Eyes: Conjunctivae and EOM are normal. No scleral icterus.  Neck: No thyromegaly present.  Cardiovascular: Normal rate, regular rhythm and normal heart sounds.  No murmur heard. Pulmonary/Chest: Effort normal. No respiratory distress. He has no wheezes. He has rales (Faint bilaterally).  Abdominal: Soft. He exhibits no distension. There is no tenderness.  Musculoskeletal: He exhibits edema (2+ both lower extremities).  Lymphadenopathy:    He has no cervical adenopathy.  Neurological: He is alert and oriented to person, place, and time. No cranial nerve deficit. He exhibits abnormal muscle tone.  Skin: Skin is warm and dry.  Vitals reviewed.    Diagnostic Tests: CT CHEST  WITHOUT CONTRAST  TECHNIQUE: Multidetector CT imaging of the chest was performed following the standard protocol without intravenous contrast. High resolution imaging of the lungs, as well as inspiratory and expiratory imaging, was performed.  COMPARISON:  No available priors for comparison.  FINDINGS: Cardiovascular: Atherosclerotic calcification of the arterial vasculature. Heart is at the upper limits of normal in size. No pericardial effusion.  Mediastinum/Nodes: Mediastinal lymph nodes are not enlarged by CT size criteria. Hilar regions are difficult to definitively evaluate without IV contrast. No axillary adenopathy. Esophagus is grossly unremarkable.  Lungs/Pleura: Paraseptal emphysema. Somewhat basilar predominant pattern of subpleural reticulation, ground-glass and mild traction bronchiolectasis. No definite honeycombing. No air trapping. No pleural fluid. Airway is unremarkable.  Upper Abdomen: Visualized portion of the liver is unremarkable. Stones are seen in the gallbladder. Visualized portions of the adrenal glands, kidneys, spleen, pancreas, stomach and bowel are grossly unremarkable. No upper abdominal adenopathy.  Musculoskeletal:  Degenerative changes in the spine. No worrisome lytic or sclerotic lesions. Lower thoracic compression fracture, likely old.  IMPRESSION: 1. Pulmonary parenchymal pattern of fibrosis may be due to fibrotic nonspecific interstitial pneumonitis or usual interstitial pneumonitis (ATS criteria = possible UIP). 2.  Aortic atherosclerosis (ICD10-170.0). 3.  Emphysema (ICD10-J43.9). 4. Cholelithiasis.   Electronically Signed   By: Leanna Battles M.D.   On: 07/28/2017 15:24 I personally reviewed the CT images and concur with the findings noted above  Impression: Chase Kim is a 61 year old gentleman with multiple medical problems who has an 30-month history of progressive cough and shortness of breath.  He has interstitial lung disease.  There is some question of whether this represents UIP, IPF or a hypersensitivity pneumonitis.  A lung biopsy could potentially clarify that issue.  He would be a relatively high risk candidate for a lung biopsy given his medical history, particularly his cardiac history.  He does not give a history currently that sounds classic for angina but his activities are extremely limited so it is impossible to rule out some underlying problem.  He would definitely need cardiology clearance prior to surgery and would also need his Plavix held for 5 days prior to surgery.  I discussed the general nature of lung biopsy with Chase Kim and his daughter.  They understand this will be done in the operating room under general anesthesia.  We discussed the incisions to be used, the general nature of the procedure, the use of drainage tubes postoperatively, the expected hospital stay, and the recovery time.  They understand that no guarantee of a definitive diagnosis can be given.  I reviewed the indications, risks, benefits, and alternatives.  They understand the risks include, but are not limited to death, MI, stroke, DVT, PE, bleeding, possible need for transfusion, infection,  prolonged air leak, cardiac arrhythmias, chronic pain, as well as the possibility of other unforeseeable complications.  I did discuss the possibility of participating in the BRAVE study with him.  This would involve bronchoscopy and transbronchial biopsy prior to his surgical biopsy.  There is minimal additional risk associated with that.  He wishes to think this over and discuss with his family before making a decision.  Plan: He will call if he wants to proceed with right VATS for lung biopsy.  Would need cardiology clearance and discontinuation of Plavix for 5 days prior to biopsy.  Loreli Slot, MD [facility] [phone]

## 2017-10-14 ENCOUNTER — Telehealth: Payer: Self-pay | Admitting: Internal Medicine

## 2017-10-14 NOTE — Telephone Encounter (Signed)
MR---  Pt recently had a stroke and will be sent home from rehab and they said that he would benefit to go home with a cpap machine.  He has not been set up with one but MR did do a HST on 07/27/2017.  The daughter wanted to see if MR would be willing to set the pt up with the cpap for him to go home on. MR please advise. Thanks

## 2017-10-15 NOTE — Telephone Encounter (Signed)
I thought  What I did 08/27/17 was just ONO. How can I order CPAP off that? Also, it seems he has prior dx of OSA. Who was his sleep doc?  Dr. Kalman ShanMurali Sharhonda Atwood, M.D., [facility] Pulmonary and Critical Care Medicine Staff Physician, [facility] Center Director - Interstitial Lung Disease  Program  Pulmonary Fibrosis [facility] [place], [place], [id number]  Pager: [phone], If no answer or between  15:00h - 7:00h: call [phone] Telephone: [phone]

## 2017-10-15 NOTE — Telephone Encounter (Signed)
Left message for patient's spouse since April is not on DPR.

## 2017-10-16 NOTE — Telephone Encounter (Signed)
lmtcb x2 for pt's spouse.  

## 2017-10-17 NOTE — Telephone Encounter (Signed)
Attempted to contact pt's spouse, Elnita MaxwellCheryl. There was no answer and I could not leave a message due to her voicemail being full.

## 2017-10-20 NOTE — Telephone Encounter (Signed)
Will close this message since they have been contacted 3 times without a response.

## 2017-10-21 ENCOUNTER — Ambulatory Visit: Payer: Medicare HMO | Admitting: Internal Medicine

## 2017-10-21 ENCOUNTER — Telehealth: Payer: Self-pay | Admitting: Internal Medicine

## 2017-10-21 NOTE — Telephone Encounter (Signed)
Type Date User Summary Attachment  General 08/28/2017 9:01 AM Donnamae JudeMcMichael, Sherry P - -  Note   Confirmation received from fax sent to [facility].        Type Date User Summary Attachment  General 08/28/2017 8:42 AM Donnamae JudeMcMichael, Sherry P - -  Note   Order faxed to [facility].        Type Date User Summary Attachment  Provider Comments 08/27/2017 5:46 PM Sheran LuzEast, Elise K, RN Provider Comments -  Note   Route (nasal cannula OR mask): nasal cannula Liter Flow: 2lpm Frequency (continuous with stationary and portable oxygen unit needed OR only at night): nocturnal  Was this based off an ONO?: yes DME: New to [facility] Oxygen Conserving Device (Yes or No): no Type of Oxygen (Gas or Liquid): Gas          Spoke with April and advised her the order was sent to [facility]. I gave her the number to call them. I called them and they stated they were waiting on a hardship form because pt stated he could not afford the oxygen. April will call to give them her address so she can receive the form. Nothing further is needed.

## 2017-11-18 ENCOUNTER — Ambulatory Visit: Payer: Medicare HMO | Admitting: Internal Medicine

## 2017-11-18 ENCOUNTER — Telehealth: Payer: Self-pay | Admitting: Internal Medicine

## 2017-11-18 ENCOUNTER — Encounter: Payer: Self-pay | Admitting: Internal Medicine

## 2017-11-18 ENCOUNTER — Ambulatory Visit
Admission: RE | Admit: 2017-11-18 | Discharge: 2017-11-18 | Disposition: A | Discharge status: Home / Self Care | Payer: Self-pay | Source: Ambulatory Visit | Attending: Internal Medicine | Admitting: Internal Medicine

## 2017-11-18 ENCOUNTER — Other Ambulatory Visit (INDEPENDENT_AMBULATORY_CARE_PROVIDER_SITE_OTHER): Payer: Medicare HMO

## 2017-11-18 ENCOUNTER — Ambulatory Visit (INDEPENDENT_AMBULATORY_CARE_PROVIDER_SITE_OTHER): Payer: Medicare HMO | Admitting: Internal Medicine

## 2017-11-18 VITALS — BP 104/70 | HR 58 | Ht 70.0 in | Wt 220.0 lb

## 2017-11-18 DIAGNOSIS — Z77120 Contact with and (suspected) exposure to mold (toxic): Secondary | ICD-10-CM

## 2017-11-18 DIAGNOSIS — J849 Interstitial pulmonary disease, unspecified: Secondary | ICD-10-CM

## 2017-11-18 DIAGNOSIS — Z5181 Encounter for therapeutic drug level monitoring: Secondary | ICD-10-CM | POA: Diagnosis not present

## 2017-11-18 LAB — BASIC METABOLIC PANEL WITH GFR
BUN: 13 mg/dL (ref 6–23)
CO2: 28 meq/L (ref 19–32)
Calcium: 9.6 mg/dL (ref 8.4–10.5)
Chloride: 104 meq/L (ref 96–112)
Creatinine, Ser: 1.13 mg/dL (ref 0.40–1.50)
GFR: 69.99 mL/min
Glucose, Bld: 106 mg/dL — ABNORMAL HIGH (ref 70–99)
Potassium: 4.1 meq/L (ref 3.5–5.1)
Sodium: 137 meq/L (ref 135–145)

## 2017-11-18 LAB — PULMONARY FUNCTION TEST
DL/VA % pred: 54 %
DL/VA: 2.54 ml/min/mmHg/L
DLCO UNC % PRED: 39 %
DLCO UNC: 12.85 ml/min/mmHg
FEF 25-75 Pre: 3.6 L/sec
FEF2575-%PRED-PRE: 124 %
FEV1-%Pred-Pre: 83 %
FEV1-Pre: 2.99 L
FEV1FVC-%Pred-Pre: 110 %
FEV6-%PRED-PRE: 79 %
FEV6-Pre: 3.6 L
FEV6FVC-%PRED-PRE: 105 %
FVC-%Pred-Pre: 76 %
FVC-Pre: 3.6 L
PRE FEV1/FVC RATIO: 83 %
PRE FEV6/FVC RATIO: 100 %

## 2017-11-18 LAB — HEPATIC FUNCTION PANEL
ALK PHOS: 60 U/L (ref 39–117)
ALT: 12 U/L (ref 0–53)
AST: 14 U/L (ref 0–37)
Albumin: 3.9 g/dL (ref 3.5–5.2)
Bilirubin, Direct: 0.1 mg/dL (ref 0.0–0.3)
TOTAL PROTEIN: 7.1 g/dL (ref 6.0–8.3)
Total Bilirubin: 0.8 mg/dL (ref 0.2–1.2)

## 2017-11-18 NOTE — Patient Instructions (Addendum)
ICD-10-CM   1. ILD (interstitial lung disease) (HCC) J84.9   2. Mold exposure Z77.120   3. Therapeutic drug monitoring Z51.81    Good chance this is IPF v hypersensitivity pneumonitis  Plan  - agree lung biopsy risky as of now and hold off  - glad you are out of the house; would avoid even transient entrance to the home  - recheck bmet and lft to assess suitability for restart esbriet ( empiric for IPF and might even work in HP if trials pan out to be true) - hold off prednisone for possible HP due to side effects - will upload CD ROM from [facility] in our system - thanks for giving it and next visit we can compare - take note that ILD is likely from mold exposure  Followup - await call from blood work  - 4-8 weeks do Pre-bd spiro and dlco only. No lung volume or bd response. No post-bd spiro - return to see DR Makynna Manocchio in ILD clinic; simple walk test at followup

## 2017-11-18 NOTE — Addendum Note (Signed)
Addended by: Wyvonne LenzPINION, Khalea Ventura P on: 11/18/2017 12:33 PM   Modules accepted: Orders

## 2017-11-18 NOTE — Telephone Encounter (Signed)
Pt was seen at office today for a visit by MR. Pt had papers he needed to have signed and dated by MR.  MR has signed and dated all the papers and has returned them back to me.  Called pt letting him know papers were ready for him to pick up.  Pt stated he would come by probably tomorrow, 11/19/17 to pick papers up.  I stated to pt to ask for me when he comes and I will give them to him.  Nothing further needed at this current time.

## 2017-11-18 NOTE — Progress Notes (Signed)
Subjective:     Patient ID: Chase Kim, male   DOB: [birth year], 62 y.o.   MRN: [id number]  HPI PCP Charlott Rakes, MD  HPI   IOV 07/22/2017  Chief Complaint  Patient presents with  . Advice Only    Referred by Marzetta Board from [place] for interstitial pulmonary fibrosis.  C/o prod. cough with white to dark brown with bloody mucus, SOB on exertion, and CP. 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 He tells me that he said insidious onset of cough for the last few years particularly worse in the last 1 year. Approximately 1 year ago he ended up getting a second vaccine of Prevnar 23 at [facility]. Apparently after this the cough started getting worse since then it is been progressive also associated with progressive shortness of breath. The cough was getting worse despitestopping ace inhibitors. Then in February2018 he had the CT scan of the chest.  cT scan of the chest without contrast done [facility]: First of all it is unclear to me if this is a high-resolution CT scan of the chest. The report does not talk about UIP or possible UIP.the CT scan talks about "subpleural reticular opacities and scattered mild subpleural cystic changes throughout both lungs favored to represent chronic fibrosis. In additionthis reticular linear opacities involving the anterior portions of the lower lobes and lingula  And lateral left upper lobe favored to represent chronic scarring, atelectasis or fibrosis." ,. Review of the [facility] notes indicate that on 11/29/2016 Dr Kathi Ludwig did notice the presence of emphysema on the above changes and also the absence of honeycombing and use the CT scan to diagnose IPF. HIs noted July 2018 specifically states that he felt patient had bilateral  subpleural reticulation without groundglass  opacitiesdistributed in all the lobes. He also thought there was some subpleural honeycomb changing. He felt the CT scan was classic for UIP.By this time patient had seen rheumatologist as I confirmed that on the [facility] notes A diagnosis of IPF was established. Patient was started on Pirfenidone (Esbriet). He completed 8 weeks of this at this point and he is tolerating it fine. LAst LFT Sep 2018   His pulmonary function test on 06/18/2017 shows FVC 3.77 L/79.6% predicted, total lung capacity of 5.34 L/76.3% predicted and DLCO 13.15/38.4% predicted[uncorrected DLCO for hemoglobin]  Walking desaturation test 185 feet 3 laps on room air today: Resting pulse ox 99%. Final pulse ox 94%. Resting heart rate 81/m. Final heart rate 108/m   Autoimmune lab work shows 03/03/2017 CCP less than 5but his rheumatoid factor on the same day was elevated at 80  Other lab work shows an elevation in creatinine on 05/31/2007 1.4 mg percent but is in June it was 1.25 mg percent. Liver function test normal as of September 2018 He is also little bit anemic with 05/30/2017 hemoglobin 12.8 g percent which is slightly down compared to 13.4 g percent in May 2017.nuclear medicine cardiac stress test shows ejection fraction 52% with normal perfusion [facility] May    He is here with Chase Kim son in law both of them extremely worried about the cough and life expectancy. Asking about research trials.in particular they've done some research and stem cell trials. We discussed this extensively.   Also gives a history of sleep  apnea diagnosed several years ago but insurance did not cover his CPAP she is not using it. He tells me that he does have nocturnal desaturations as tested by his wife at home  [employer] of chest physicians interstitial lung disease questionnaire :: filled and returned the following day and entered by me on 08/01/2017  - Symptoms: cough - moste days +, severe , night +, mucus +,  hemotpysis +, x 1.5 years. Dyspnea - clas 3 - Pmhx - heard dz, stroke,   - ROS: GERD +, dry skin +, arthralgia + - Personal exposure: did used street drugs. Smoked cig age [age], 2ppd - Fam hx: COPD +, rest negative - Home - > 27 years old, There is water damage, mold, animals - bids - Occupation:  Worked for [employer],. X 16.5 years. Dust +, Insulation +, Smoke +, Crawlspace_. Also did work in [employer], Environmental education officer and Oncologist, Marine scientist, asbestos, potery, dtergent, Psychologist, forensic, brakes, malt, - Pneumptox: negative     Acute visit 08/06/17 62 year old with history of IPF, previously followed at [facility] and on [place]. Seen in consultation at [facility] on 10/23 by Dr. Chase Caller He has complaints of increasing cough with dyspnea, fever for the past 1 week.  He developed diarrhea and nausea a few days ago.  Pets: Has dogs, no birds, farm animals, exotic pets Occupation: Works for [employer] with exposure to dust, installation, smoke, possible asbestos. Exposures: Has significant issues with mold from a roof leak for the past 2 years. No jacuzzi, hot tubs. Smoking history: 55-pack-year.  Quit in 2003 Travel History: Not significant   OV 08/27/2017  Chief Complaint  Patient presents with  . Follow-up    Pt is currently on Esbriet. Still has complaints of a cough with mild SOB with exertion.     FU ILD - clinical dx of IPF at [facility]. Now under care at [facility]. Workup in Progress    Presents with daughter Chase Kim, Chase Kim and her husband Chase Kim. Wife not present.  Here to review ILD workup for diagnosis and management. They have lot of questions. Etiologic workup so far: Hx revealed significant mold exposure at  Home x 2 year (they are tryhing to get rid of it). He is also reporting recurrent respiratory exacerbation needing prednisone. Daughter thinks atleast 4 times this  Year. Autoimmune panel (below) negative except RF. VAsculitis panel negative. HP panel  negative HRCT read by Dr Rosario Jacks and I d/;w her via email again today and personally saw CT and agree with findings: Old ATS possible UIP. New ATS: indeterminate v probable -> due to lack of honeycombing and nothing beyond mild traction bronchieclectasis. There is associaed emphysema though  Severity: he seems more hampered by his obesity and pain issues. ONO - 10/27 desatured for 160 min. Last visit did not desaturate with walking  Co morbidity:  He does have gerd under control. Has CAD on plavix but 2017 stress test was normal. He has had stroke before and has mild carotid plaque per hx  Therapy: currently in esbriet since sept 2017 and tolerating well. Does have nocturia but probasbly associated with increased water and coffee in day time   IMPRESSION: HRCT Lungs/Pleura: Paraseptal emphysema. Somewhat basilar predominant pattern of subpleural reticulation, ground-glass and mild traction bronchiolectasis. No definite honeycombing. No air trapping. No pleural fluid. Airway is unremarkable. 1. Pulmonary parenchymal pattern of fibrosis may be due to fibrotic nonspecific interstitial pneumonitis or usual interstitial pneumonitis (ATS criteria =  possible UIP). 2.  Aortic atherosclerosis (ICD10-170.0). 3.  Emphysema (ICD10-J43.9). 4. Cholelithiasis.   Electronically Signed   By: Lorin Picket M.D.   On: 07/28/2017 15:24   Results for GALAN, GHEE (MRN [id number]) as of 08/27/2017 12:18  Ref. Range 07/23/2017 09:05  Anit Nuclear Antibody(ANA) Latest Ref Range: NEGATIVE  NEGATIVE  ANCA SCREEN Latest Ref Range: Negative  Negative  Angiotensin-Converting Enzyme Latest Ref Range: 9 - 67 U/L 38  Cyclic Citrullin Peptide Ab Latest Units: UNITS <16  ds DNA Ab Latest Units: IU/mL 2  Myeloperoxidase Abs Latest Units: AI <1.0  Serine Protease 3 Latest Units: AI <1.0  RA Latex Turbid. Latest Ref Range: <14 IU/mL 119 (H)  SSA (Ro) (ENA) Antibody, IgG Latest Ref Range: <1.0 NEG AI <1.0 NEG   SSB (La) (ENA) Antibody, IgG Latest Ref Range: <1.0 NEG AI <1.0 NEG  Scleroderma (Scl-70) (ENA) Antibody, IgG Latest Ref Range: <1.0 NEG AI <1.0 NEG  Results for JARELLE, ATES (MRN [id number]) as of 08/27/2017 12:18  Ref. Range 08/13/2017 11:21  Candida albicans Latest Units: kU/L <0.10  IgE (Immunoglobulin E), Serum Latest Ref Range: <OR=114 kU/L 13  A.Fumigatus #1 Abs Latest Ref Range: Negative  Negative  Micropolyspora faeni, IgG Latest Ref Range: Negative  Negative  Thermoactinomyces vulgaris, IgG Latest Ref Range: Negative  Negative  A. Pullulans Abs Latest Ref Range: Negative  Negative  Thermoact. Saccharii Latest Ref Range: Negative  Negative  Pigeon Serum Abs Latest Ref Range: Negative  Negative     OV 11/18/2017  Chief Complaint  Patient presents with  . Follow-up    Seen by Dr. Jennye Moccasin presents for follow-up.  Working diagnosis is idiopathic pulmonary fibrosis established at [facility].  After I met him I realized that he has significant mold exposure.  His high-resolution CT scan chest done with US showed possible UIP thus lowering the probability that this is in fact UIP.  I sent him to Dr. Roxan Hockey for surgical lung biopsy evaluation.  However in the interim he admitted at [facility] with intracranial hemorrhage according to his history.  He spent a total of 3 weeks between intensive care and inpatient rehab.  He has been discharged with just mild residual defect of mild vision loss in his left eye and a very mild weakness on his left side.  During this time at the hospitalization his Pirfenidone (Esbriet) was held because of "chronic kidney disease".  He says that he was not aware of this diagnosis until after his discharge from the hospital.  His Plavix is on hold is only taking aspirin.  He believes the reason for the intracranial hemorrhage was just hypertension.  At this point in time he is no longer in the house where he has  significant mold exposure.  He had an extensive home inspection done.  He showed me the paperwork for this.  There is definite positive mold exposure in the house.  He is living with his daughter now where there is no mold exposure and he feels because of this his respiratory status is improved.  Occasionally still goes into his house to fetch his computers and at this time he starts coughing and wheezing significantly.  He wants a letter wondering if his pulmonary fibrosis interstitial lung disease is related to the mold exposure.  There are no other new issues.  Review of his chemistry results show that in June 2018 at [facility]: His creatinine is 1.25 mg percent GFR  greater than 60.  But in August 2018 his creatinine is 1.4 mg percent with a GFR of 51.  Then at [facility] his creatinine was 1.1 mg percent with a GFR of 72. So was all ok  Results for LEONARDO, MAKRIS (MRN [id number]) as of 11/18/2017 11:38  Ref. Range 11/18/2017 10:17  FVC-Pre Latest Units: L 3.60  FVC-%Pred-Pre Latest Units: % 76   Results for KRISTA, SOM (MRN [id number]) as of 11/18/2017 11:38  Ref. Range 11/18/2017 10:17  DLCO unc Latest Units: ml/min/mmHg 12.85  DLCO unc % pred Latest Units: % 39     has a past medical history of Angina pectoris (HCC), Bilateral low back pain with left-sided sciatica, Bronchitis, chronic (HCC), Chest pain, Chronic bronchitis (HCC), Chronic cough, DDD (degenerative disc disease), lumbar, Elevated troponin, GERD (gastroesophageal reflux disease), Hyperlipidemia, Hypertension, Insomnia, IPF (idiopathic pulmonary fibrosis) ([place]), Lumbar facet arthropathy, Neuropathic pain, Non morbid obesity due to excess calories, Poor tolerance for activity, Sleep apnea, and Stroke ([place]).   reports that he quit smoking about 16 years ago. His smoking use included cigarettes. He has a 54.00 pack-year smoking history. he has never used smokeless tobacco.  Past Surgical History:  Procedure Laterality Date  .  triple bypass surgery      Allergies  Allergen Reactions  . Penicillins Swelling  . Benadryl [Diphenhydramine] Hives    Immunization History  Administered Date(s) Administered  . Pneumococcal Conjugate-13 10/04/2013  . Pneumococcal Polysaccharide-23 01/31/2016    Family History  Problem Relation Age of Onset  . Alzheimer's disease Mother   . Leukemia Father   . Lung cancer Sister      Current Outpatient Medications:  .  albuterol (PROVENTIL HFA;VENTOLIN HFA) 108 (90 Base) MCG/ACT inhaler, Inhale into the lungs., Disp: , Rfl:  .  aspirin EC 325 MG tablet, Take 325 mg by mouth., Disp: , Rfl:  .  benzonatate (TESSALON) 100 MG capsule, TAKE 1 CAPSULE BY MOUTH EVERY 8 HOURS, Disp: , Rfl: 0 .  budesonide-formoterol (SYMBICORT) 80-4.5 MCG/ACT inhaler, Inhale into the lungs., Disp: , Rfl:  .  clopidogrel (PLAVIX) 75 MG tablet, clopidogrel 75 mg tablet, Disp: , Rfl:  .  cyclobenzaprine (FLEXERIL) 10 MG tablet, Take by mouth., Disp: , Rfl:  .  esomeprazole (NEXIUM) 40 MG capsule, Take 40 mg by mouth., Disp: , Rfl:  .  furosemide (LASIX) 40 MG tablet, , Disp: , Rfl:  .  gabapentin (NEURONTIN) 400 MG capsule, , Disp: , Rfl:  .  isosorbide mononitrate (IMDUR) 30 MG 24 hr tablet, , Disp: , Rfl:  .  ketoconazole (NIZORAL) 2 % shampoo, Apply topically., Disp: , Rfl:  .  metoprolol succinate (TOPROL-XL) 50 MG 24 hr tablet, , Disp: , Rfl:  .  nitroGLYCERIN (NITROSTAT) 0.4 MG SL tablet, , Disp: , Rfl:  .  Pirfenidone (ESBRIET) 267 MG TABS, Esbriet 267 mg tablet, Disp: , Rfl:  .  polyethylene glycol (MIRALAX / GLYCOLAX) packet, Take 17 g by mouth., Disp: , Rfl:  .  ranolazine (RANEXA) 500 MG 12 hr tablet, Take 500 mg by mouth., Disp: , Rfl:  .  rosuvastatin (CRESTOR) 20 MG tablet, Take 20 mg by mouth., Disp: , Rfl:  .  traZODone (DESYREL) 100 MG tablet, Take 100 mg by mouth., Disp: , Rfl:  .  zolpidem (AMBIEN) 10 MG tablet, zolpidem 10 mg tablet, Disp: , Rfl:    Review of Systems      Objective:   Physical Exam  Constitutional: He is oriented to person,  place, and time. He appears well-developed and well-nourished. No distress.  HENT:  Head: Normocephalic and atraumatic.  Right Ear: External ear normal.  Left Ear: External ear normal.  Mouth/Throat: Oropharynx is clear and moist. No oropharyngeal exudate.  Eyes: Conjunctivae and EOM are normal. Pupils are equal, round, and reactive to light. Right eye exhibits no discharge. Left eye exhibits no discharge. No scleral icterus.  Neck: Normal range of motion. Neck supple. No JVD present. No tracheal deviation present. No thyromegaly present.  Cardiovascular: Normal rate, regular rhythm and intact distal pulses. Exam reveals no gallop and no friction rub.  No murmur heard. Pulmonary/Chest: Effort normal and breath sounds normal. No respiratory distress. He has no wheezes. He has no rales. He exhibits no tenderness.  Clubbing + Has some basal crackles+  Abdominal: Soft. Bowel sounds are normal. He exhibits no distension and no mass. There is no tenderness. There is no rebound and no guarding.  Musculoskeletal: Normal range of motion. He exhibits no edema or tenderness.  Lymphadenopathy:    He has no cervical adenopathy.  Neurological: He is alert and oriented to person, place, and time. He has normal reflexes. No cranial nerve deficit. Coordination normal.  Skin: Skin is warm and dry. No rash noted. He is not diaphoretic. No erythema. No pallor.  Psychiatric: He has a normal mood and affect. His behavior is normal. Judgment and thought content normal.  Nursing note and vitals reviewed.  There were no vitals filed for this visit.  Estimated body mass index is 30.84 kg/m as calculated from the following:   Height as of 09/16/17: 5' 10.5" (1.791 m).   Weight as of 09/16/17: 218 lb (98.9 kg).     Assessment:       ICD-10-CM   1. ILD (interstitial lung disease) ([place]) J84.9   2. Mold exposure Z77.120   3. Therapeutic  drug monitoring Z51.81        Plan:     .Good chance this is IPF v hypersensitivity pneumonitis  Plan  - agree lung biopsy risky as of now and hold off  - glad you are out of the house; would avoid even transient entrance to the home  - recheck bmet and lft to assess suitability for restart esbriet ( empiric for IPF and might even work in HP if trials pan out to be true) - hold off prednisone for possible HP due to side effects - will upload CD ROM from [place] in our system - thanks for giving it and next visit we can compare - take note that ILD is likely from mold exposure  Followup - await call from blood work  - 4-8 weeks do Pre-bd spiro and dlco only. No lung volume or bd response. No post-bd spiro - return to see DR Lugene Beougher in ILD clinic; simple walk test at followup      > 50% of this > 25 min visit spent in face to face counseling or coordination of care    Dr. Brand Males, M.D., [facility].C.P Pulmonary and Critical Care Medicine Staff Physician, [place] Director - Interstitial Lung Disease  Program  Pulmonary [place] at [place], [place], [id number]  Pager: [phone], If no answer or between  15:00h - 7:00h: call [phone] Telephone: [phone]

## 2017-11-18 NOTE — Progress Notes (Signed)
PFT completed today.  

## 2017-12-30 ENCOUNTER — Ambulatory Visit: Payer: Medicare HMO | Admitting: Internal Medicine

## 2017-12-30 ENCOUNTER — Encounter: Payer: Self-pay | Admitting: Internal Medicine

## 2017-12-30 VITALS — BP 124/78 | HR 65 | Ht 70.0 in | Wt 192.6 lb

## 2017-12-30 DIAGNOSIS — Z77120 Contact with and (suspected) exposure to mold (toxic): Secondary | ICD-10-CM | POA: Diagnosis not present

## 2017-12-30 DIAGNOSIS — R059 Cough, unspecified: Secondary | ICD-10-CM

## 2017-12-30 DIAGNOSIS — R05 Cough: Secondary | ICD-10-CM

## 2017-12-30 DIAGNOSIS — J849 Interstitial pulmonary disease, unspecified: Secondary | ICD-10-CM

## 2017-12-30 NOTE — Progress Notes (Signed)
Subjective:     Patient ID: Chase Kim, male   DOB: 1956-04-12, 62 y.o.   MRN: [id number]  HPI  PCP Charlott Rakes, MD  HPI   IOV 07/22/2017  Chief Complaint  Patient presents with  . Advice Only    Referred by Marzetta Board from [place] for interstitial pulmonary fibrosis.  C/o prod. cough with white to dark brown with bloody mucus, SOB on exertion, and CP. 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 He tells me that he said insidious onset of cough for the last few years particularly worse in the last 1 year. Approximately 1 year ago he ended up getting a second vaccine of Prevnar 23 at [facility]. Apparently after this the cough started getting worse since then it is been progressive also associated with progressive shortness of breath. The cough was getting worse despitestopping ace inhibitors. Then in February2018 he had the CT scan of the chest.  cT [facility] of the [facility] contrast [facility]: First of all it is unclear to me if this is a high-resolution CT scan of the chest. The report does not talk about UIP or possible UIP.the CT scan talks about "subpleural reticular opacities and scattered mild subpleural cystic changes throughout both lungs favored to represent chronic fibrosis. In additionthis reticular linear opacities involving the anterior portions of the lower lobes and lingula  And lateral left upper lobe favored to represent chronic scarring, atelectasis or fibrosis." ,. Review of the [facility] notes indicate that on 11/29/2016 Dr Kathi Ludwig did notice the presence of emphysema on the above changes and also the absence of honeycombing and use the CT scan to diagnose IPF. HIs noted July 2018 specifically states that he felt patient had bilateral  subpleural reticulation without groundglass  opacitiesdistributed in all the lobes. He also thought there was some subpleural honeycomb changing. He felt the CT scan was classic for UIP.By this time patient had seen rheumatologist as I confirmed that on the [facility] notes A diagnosis of IPF was established. Patient was started on Pirfenidone (Esbriet). He completed 8 weeks of this at this point and he is tolerating it fine. LAst LFT Sep 2018   His pulmonary function test on 06/18/2017 shows FVC 3.77 L/79.6% predicted, total lung capacity of 5.34 L/76.3% predicted and DLCO 13.15/38.4% predicted[uncorrected DLCO for hemoglobin]  Walking desaturation test 185 feet 3 laps on room air today: Resting pulse ox 99%. Final pulse ox 94%. Resting heart rate 81/m. Final heart rate 108/m   Autoimmune lab work shows 03/03/2017 CCP less than 5but his rheumatoid factor on the same day was elevated at 80  Other lab work shows an elevation in creatinine on 05/31/2007 1.4 mg percent but is in June it was 1.25 mg percent. Liver function test normal as of September 2018 He is also little bit anemic with 05/30/2017 hemoglobin 12.8 g percent which is slightly down compared to 13.4 g percent in May 2017.nuclear medicine cardiac stress test shows ejection fraction 52% with normal perfusion [facility]    He is here with TRavis son in law both of them extremely worried about the cough and life expectancy. Asking about research trials.in particular they've done some research and stem cell trials. We discussed this extensively.   Also gives a history of  sleep apnea diagnosed several years ago but insurance did not cover his CPAP she is not using it. He tells me that he does have nocturnal desaturations as tested by his wife at home  [employer] of chest physicians interstitial lung disease questionnaire :: filled and returned the following day and entered by me on 08/01/2017  - Symptoms: cough - moste days +, severe , night +, mucus +,  hemotpysis +, x 1.5 years. Dyspnea - clas 3 - Pmhx - heard dz, stroke,   - ROS: GERD +, dry skin +, arthralgia + - Personal exposure: did used street drugs. Smoked cig age [age], 2ppd - Fam hx: COPD +, rest negative - Home - > 60 years old, There is water damage, mold, animals - bids - Occupation:  Worked for [employer],. X 16.5 years. Dust +, Insulation +, Smoke +, Crawlspace_. Also did work in [employer], Environmental education officer and Oncologist, Marine scientist, asbestos, potery, dtergent, Psychologist, forensic, brakes, malt, - Pneumptox: negative     Acute visit 08/06/17 62 year old with history of IPF, previously followed at [facility] and on [place]. Seen in consultation at [facility] on 10/23 by Dr. Chase Caller He has complaints of increasing cough with dyspnea, fever for the past 1 week.  He developed diarrhea and nausea a few days ago.  Pets: Has dogs, no birds, farm animals, exotic pets Occupation: Works for [employer] with exposure to dust, installation, smoke, possible asbestos. Exposures: Has significant issues with mold from a roof leak for the past 2 years. No jacuzzi, hot tubs. Smoking history: 55-pack-year.  Quit in 2003 Travel History: Not significant   OV 08/27/2017  Chief Complaint  Patient presents with  . Follow-up    Pt is currently on Esbriet. Still has complaints of a cough with mild SOB with exertion.     FU ILD - clinical dx of IPF at [facility]. Now under care at [facility]. Workup in Progress    Presents with daughter April, Anne and her husband Darnelle Maffucci. Wife not present.  Here to review ILD workup for diagnosis and management. They have lot of questions. Etiologic workup so far: Hx revealed significant mold exposure at  Home x 2 year (they are tryhing to get rid of it). He is also reporting recurrent respiratory exacerbation needing prednisone. Daughter thinks atleast 4 times this  Year. Autoimmune panel (below) negative except RF. VAsculitis panel negative. HP panel  negative HRCT read by Dr Rosario Jacks and I d/;w her via email again today and personally saw CT and agree with findings: Old ATS possible UIP. New ATS: indeterminate v probable -> due to lack of honeycombing and nothing beyond mild traction bronchieclectasis. There is associaed emphysema though  Severity: he seems more hampered by his obesity and pain issues. ONO - 10/27 desatured for 160 min. Last visit did not desaturate with walking  Co morbidity:  He does have gerd under control. Has CAD on plavix but 2017 stress test was normal. He has had stroke before and has mild carotid plaque per hx  Therapy: currently in esbriet since sept 2017 and tolerating well. Does have nocturia but probasbly associated with increased water and coffee in day time   IMPRESSION: HRCT Lungs/Pleura: Paraseptal emphysema. Somewhat basilar predominant pattern of subpleural reticulation, ground-glass and mild traction bronchiolectasis. No definite honeycombing. No air trapping. No pleural fluid. Airway is unremarkable. 1. Pulmonary parenchymal pattern of fibrosis may be due to fibrotic nonspecific interstitial pneumonitis or usual interstitial pneumonitis (ATS criteria =  possible UIP). 2.  Aortic atherosclerosis (ICD10-170.0). 3.  Emphysema (ICD10-J43.9). 4. Cholelithiasis.   Electronically Signed   By: Lorin Picket M.D.   On: 07/28/2017 15:24   Results for GALAN, GHEE (MRN [id number]) as of 08/27/2017 12:18  Ref. Range 07/23/2017 09:05  Anit Nuclear Antibody(ANA) Latest Ref Range: NEGATIVE  NEGATIVE  ANCA SCREEN Latest Ref Range: Negative  Negative  Angiotensin-Converting Enzyme Latest Ref Range: 9 - 67 U/L 38  Cyclic Citrullin Peptide Ab Latest Units: UNITS <16  ds DNA Ab Latest Units: IU/mL 2  Myeloperoxidase Abs Latest Units: AI <1.0  Serine Protease 3 Latest Units: AI <1.0  RA Latex Turbid. Latest Ref Range: <14 IU/mL 119 (H)  SSA (Ro) (ENA) Antibody, IgG Latest Ref Range: <1.0 NEG AI <1.0 NEG   SSB (La) (ENA) Antibody, IgG Latest Ref Range: <1.0 NEG AI <1.0 NEG  Scleroderma (Scl-70) (ENA) Antibody, IgG Latest Ref Range: <1.0 NEG AI <1.0 NEG  Results for JARELLE, ATES (MRN [id number]) as of 08/27/2017 12:18  Ref. Range 08/13/2017 11:21  Candida albicans Latest Units: kU/L <0.10  IgE (Immunoglobulin E), Serum Latest Ref Range: <OR=114 kU/L 13  A.Fumigatus #1 Abs Latest Ref Range: Negative  Negative  Micropolyspora faeni, IgG Latest Ref Range: Negative  Negative  Thermoactinomyces vulgaris, IgG Latest Ref Range: Negative  Negative  A. Pullulans Abs Latest Ref Range: Negative  Negative  Thermoact. Saccharii Latest Ref Range: Negative  Negative  Pigeon Serum Abs Latest Ref Range: Negative  Negative     OV 11/18/2017  Chief Complaint  Patient presents with  . Follow-up    Seen by Dr. Jennye Moccasin presents for follow-up.  Working diagnosis is idiopathic pulmonary fibrosis established at [facility].  After I met him I realized that he has significant mold exposure.  His high-resolution CT scan chest done with US showed possible UIP thus lowering the probability that this is in fact UIP.  I sent him to Dr. Roxan Hockey for surgical lung biopsy evaluation.  However in the interim he admitted at [facility] with intracranial hemorrhage according to his history.  He spent a total of 3 weeks between intensive care and inpatient rehab.  He has been discharged with just mild residual defect of mild vision loss in his left eye and a very mild weakness on his left side.  During this time at the hospitalization his Pirfenidone (Esbriet) was held because of "chronic kidney disease".  He says that he was not aware of this diagnosis until after his discharge from the hospital.  His Plavix is on hold is only taking aspirin.  He believes the reason for the intracranial hemorrhage was just hypertension.  At this point in time he is no longer in the house where he has  significant mold exposure.  He had an extensive home inspection done.  He showed me the paperwork for this.  There is definite positive mold exposure in the house.  He is living with his daughter now where there is no mold exposure and he feels because of this his respiratory status is improved.  Occasionally still goes into his house to fetch his computers and at this time he starts coughing and wheezing significantly.  He wants a letter wondering if his pulmonary fibrosis interstitial lung disease is related to the mold exposure.  There are no other new issues.  Review of his chemistry results show that in June 2018 at [facility]: His creatinine is 1.25 mg percent GFR  greater than 60.  But in August 2018 his creatinine is 1.4 mg percent with a GFR of 51.  Then at [facility] his creatinine was 1.1 mg percent with a GFR of 72. So was all ok    OV 12/30/2017  Chief Complaint  Patient presents with  . Follow-up    SOB, increased coughing, esbriet still on hold    Follow-up interstitial lung disease with mold exposure [differential diagnosis of IPF versus chronic hypersensitivity pneumonitis] with high-resolution CT chest October showing mixed emphysema and possible UIP 2018 mold and autoimmune panel negative  He is here for follow-up.  At this visit it was supposed to be how he was doing with pirfenidone.  At last visit he expressed concern about renal insufficiency so we checked his creatinine and this was normal.  So we told him to restart his pirfenidone but he did not.  His daughter April who is here with him tells me that they are worried about starting pirfenidone because of GI side effects and his other comorbidities.  They seem more interested in ruling out hypersensitivity pneumonitis but at the same time that I do not want surgical lung biopsy.  At this point in time he still off Plavix.  Overall he is stable although 3 weeks ago he went to the mountains and then he had a worsening cough.   Saw primary care physician had 2 rounds of antibiotics and 1 5-day prednisone.  But the cough has not improved.  It is dry and he feels like he has bronchitic symptoms.  He is open to having a bronchoalveolar lavage to look for cell count to differentiate UIP versus hypersensitivity pneumonitis.   Walking desaturation test on 12/30/2017 185 feet x 3 laps on ROOM AIR:  did NOT desaturate. Rest pulse ox was 98%, final pulse ox was 94%. HR response 68/min at rest to 94/min at peak exertion. Patient Skylen Spiering  Did nto Desaturate < 88% . Gavin Potters yes did  Desaturated </= 3% points. Gavin Potters es did get tachyardic    Results for ARLIS, YALE (MRN [id number]) as of 11/18/2017 11:38  Ref. Range 11/18/2017 10:17  FVC-Pre Latest Units: L 3.60  FVC-%Pred-Pre Latest Units: % 76   Results for ODIES, DESA (MRN [id number]) as of 11/18/2017 11:38  Ref. Range 11/18/2017 10:17  DLCO unc Latest Units: ml/min/mmHg 12.85  DLCO unc % pred Latest Units: % 39      has a past medical history of Angina pectoris (HCC), Bilateral low back pain with left-sided sciatica, Bronchitis, chronic (HCC), Chest pain, Chronic bronchitis (HCC), Chronic cough, DDD (degenerative disc disease), lumbar, Elevated troponin, GERD (gastroesophageal reflux disease), Hyperlipidemia, Hypertension, Insomnia, IPF (idiopathic pulmonary fibrosis) ([place]), Lumbar facet arthropathy, Neuropathic pain, Non morbid obesity due to excess calories, Poor tolerance for activity, Sleep apnea, and Stroke ([place]).   reports that he quit smoking about 16 years ago. His smoking use included cigarettes. He has a 54.00 pack-year smoking history. He has never used smokeless tobacco.  Past Surgical History:  Procedure Laterality Date  . triple bypass surgery      Allergies  Allergen Reactions  . Penicillins Swelling  . Benadryl [Diphenhydramine] Hives    Immunization History  Administered Date(s) Administered  . Pneumococcal Conjugate-13 10/04/2013   . Pneumococcal Polysaccharide-23 01/31/2016    Family History  Problem Relation Age of Onset  . Alzheimer's disease Mother   . Leukemia Father   . Lung cancer Sister      Current Outpatient  Medications:  .  albuterol (PROVENTIL HFA;VENTOLIN HFA) 108 (90 Base) MCG/ACT inhaler, Inhale into the lungs., Disp: , Rfl:  .  aspirin EC 81 MG tablet, Take 81 mg by mouth daily., Disp: , Rfl:  .  benzonatate (TESSALON) 100 MG capsule, TAKE 1 CAPSULE BY MOUTH EVERY 8 HOURS, Disp: , Rfl: 0 .  budesonide-formoterol (SYMBICORT) 80-4.5 MCG/ACT inhaler, Inhale into the lungs., Disp: , Rfl:  .  cyclobenzaprine (FLEXERIL) 10 MG tablet, Take by mouth., Disp: , Rfl:  .  esomeprazole (NEXIUM) 40 MG capsule, Take 40 mg by mouth., Disp: , Rfl:  .  furosemide (LASIX) 40 MG tablet, , Disp: , Rfl:  .  gabapentin (NEURONTIN) 400 MG capsule, , Disp: , Rfl:  .  isosorbide mononitrate (IMDUR) 30 MG 24 hr tablet, , Disp: , Rfl:  .  ketoconazole (NIZORAL) 2 % shampoo, Apply topically., Disp: , Rfl:  .  methocarbamol (ROBAXIN) 500 MG tablet, Take by mouth., Disp: , Rfl:  .  metoprolol succinate (TOPROL-XL) 50 MG 24 hr tablet, , Disp: , Rfl:  .  nitroGLYCERIN (NITROSTAT) 0.4 MG SL tablet, , Disp: , Rfl:  .  polyethylene glycol (MIRALAX / GLYCOLAX) packet, Take 17 g by mouth., Disp: , Rfl:  .  ranolazine (RANEXA) 500 MG 12 hr tablet, Take 500 mg by mouth., Disp: , Rfl:  .  rosuvastatin (CRESTOR) 20 MG tablet, Take 20 mg by mouth., Disp: , Rfl:  .  traMADol (ULTRAM) 50 MG tablet, Take by mouth., Disp: , Rfl:  .  traZODone (DESYREL) 100 MG tablet, Take 100 mg by mouth., Disp: , Rfl:  .  lamoTRIgine (LAMICTAL) 25 MG tablet, Take by mouth., Disp: , Rfl:  .  Pirfenidone (ESBRIET) 267 MG TABS, Esbriet 267 mg tablet, Disp: , Rfl:    Review of Systems     Objective:   Physical Exam  Constitutional: He is oriented to person, place, and time. He appears well-developed and well-nourished. No distress.  HENT:  Head:  Normocephalic and atraumatic.  Right Ear: External ear normal.  Left Ear: External ear normal.  Mouth/Throat: Oropharynx is clear and moist. No oropharyngeal exudate.  Eyes: Pupils are equal, round, and reactive to light. Conjunctivae and EOM are normal. Right eye exhibits no discharge. Left eye exhibits no discharge. No scleral icterus.  Neck: Normal range of motion. Neck supple. No JVD present. No tracheal deviation present. No thyromegaly present.  Cardiovascular: Normal rate, regular rhythm and intact distal pulses. Exam reveals no gallop and no friction rub.  No murmur heard. Pulmonary/Chest: Effort normal. No respiratory distress. He has no wheezes. He has rales. He exhibits no tenderness.  Basal crackles +  Abdominal: Soft. Bowel sounds are normal. He exhibits no distension and no mass. There is no tenderness. There is no rebound and no guarding.  Musculoskeletal: Normal range of motion. He exhibits no edema or tenderness.  Lymphadenopathy:    He has no cervical adenopathy.  Neurological: He is alert and oriented to person, place, and time. He has normal reflexes. No cranial nerve deficit. Coordination normal.  Skin: Skin is warm and dry. No rash noted. He is not diaphoretic. No erythema. No pallor.  Psychiatric: He has a normal mood and affect. His behavior is normal. Judgment and thought content normal.  Nursing note and vitals reviewed.  Vitals:   12/30/17 1339  BP: 124/78  Pulse: 65  SpO2: 96%  Weight: 192 lb 9.6 oz (87.4 kg)  Height: _0  (1.778 m)    Estimated  body mass index is 27.64 kg/m as calculated from the following:   Height as of this encounter: _0  (1.778 m).   Weight as of this encounter: 192 lb 9.6 oz (87.4 kg).     Assessment:       ICD-10-CM   1. ILD (interstitial lung disease) ([place]) J84.9   2. Mold exposure Z77.120   3. Cough R05        Plan:      IPF v Hypersensitivity pneumonitis Recent mountain trip that resulted in  bronchitis Clinically you appear stable compared to last visit  Plan - plan bronchoscoy with BAL - 01/05/18, or 01/07/18 on afternoon at [place] - hold off prednisone until after bronch - have written to our chest raidologist to compare febv 2018 [facility] - hold off esbriet restart until after bronc - never go to your house that has mold in it; not even for 1 minute   Risks of pneumothorax, hemothorax, sedation/anesthesia complications such as cardiac or respiratory arrest or hypotension, stroke and bleeding all explained. Benefits of diagnosis but limitations of non-diagnosis also explained. Patient verbalized understanding and wished to proceed.    Followup - in 4  weeks   > 50% of this > 25 min visit spent in face to face counseling or coordination of care    Dr. Brand Males, M.D., [facility].C.P Pulmonary and Critical Care Medicine Staff Physician, [place] Director - Interstitial Lung Disease  Program  Pulmonary [place] at [place], [place], [address]  Pager: [phone], If no answer or between  15:00h - 7:00h: call [phone] Telephone: [phone]

## 2017-12-30 NOTE — Patient Instructions (Addendum)
ICD-10-CM   1. ILD (interstitial lung disease) (HCC) J84.9   2. Mold exposure Z77.120   3. Cough R05     IPF v Hypersensitivity pneumonitis Recent mountain trip that resulted in bronchitis Clinically you appear stable compared to last visit  Plan - plan bronchoscoy with BAL - 01/05/18, or 01/07/18 on afternoon at [place] - hold off prednisone until after bronch - have written to our chest raidologist to compare [facility] - hold off esbriet restart until after bronc - never go to your house that has mold in it; not even for 1 minute  Followup - in 4  weeks

## 2018-01-01 ENCOUNTER — Telehealth: Payer: Self-pay | Admitting: Internal Medicine

## 2018-01-01 NOTE — Telephone Encounter (Signed)
Called and spoke to pt.  Pt states Neurologist, Dr. Kate SableAubuchon has requested that pt hold off on Bronch.  I have AT[facility] to gather additional information- office is currently close.  Will call back on 01/02/18.  Dr. Lonia MadAubuchon's office number: [phone]

## 2018-01-02 NOTE — Telephone Encounter (Signed)
Tried to call Dr. Lonia MadAubuchon's office at [phone] and the office currently closed today X2

## 2018-01-05 NOTE — Telephone Encounter (Signed)
Patient is calling stating he wants bronch cancelled definitely scheduled for 04/10 as of right now.  CB is [phone].

## 2018-01-05 NOTE — Telephone Encounter (Signed)
Called Dr. Lonia MadAubuchon's office and left voicemail to call back.

## 2018-01-06 NOTE — Telephone Encounter (Signed)
Called and left voicemail for neurologist to call MR directly or if staff needs to talk to us if MD is unavailable to please call the office.

## 2018-01-06 NOTE — Telephone Encounter (Signed)
Called the neurology office and LM on Dr. Yevonne AlineAubrochons VM advising to call back to get further details. In the meanwhile, MR please advise id bronch can be rescheduled.

## 2018-01-06 NOTE — Telephone Encounter (Signed)
A. Why does he want bronch canceled?  B. Yes it can be rescheduled. No rush. But will be may 2019  C. Neurologist can call my cell direct - please let neuro know  [place]  Dr. Kalman ShanMurali Venice Marcucci, M.D., [facility].C.P Pulmonary and Critical Care Medicine Staff Physician, [facility] Director - Interstitial Lung Disease  Program  [facility]ulmonary [facility] at [facility] [place], [place], [id number]  Pager: [phone], If no answer or between  15:00h - 7:00h: call [phone] Telephone: [phone]

## 2018-01-06 NOTE — Telephone Encounter (Signed)
Delice Bisonara, CMA from neurologist office called back. She stated the patient's daughter must have misunderstood or misinterpreted what they were saying.  Dr. Sarajane MarekAbuchon had relayed to Delice Bisonara that patient with a history of a stroke has some risk but there is no data for the risk and he would recommend light sedation, not general anesthesia and to closely monitor blood pressure.   Called the patient and reviewed this with him. Patient stated his daughter had gotten the information and he didn't realize the suggestions. Patient is willing to have bronch but would like it to be in May.  MR please advise what date you are available for bronch and we can call the scheduler and patient and get this set up. Thanks!

## 2018-01-07 ENCOUNTER — Inpatient Hospital Stay (HOSPITAL_COMMUNITY): Admission: RE | Admit: 2018-01-07 | Payer: Medicare HMO | Source: Ambulatory Visit

## 2018-01-07 ENCOUNTER — Encounter (HOSPITAL_COMMUNITY): Admission: RE | Payer: Self-pay | Source: Ambulatory Visit

## 2018-01-07 ENCOUNTER — Ambulatory Visit (HOSPITAL_COMMUNITY): Admission: RE | Admit: 2018-01-07 | Payer: Medicare HMO | Source: Ambulatory Visit | Admitting: Internal Medicine

## 2018-01-07 SURGERY — VIDEO BRONCHOSCOPY WITHOUT FLUORO
Anesthesia: Moderate Sedation | Laterality: Bilateral

## 2018-01-07 NOTE — Telephone Encounter (Signed)
Patient daughter called April Qualls- she states that since they have cancelled the bronch can the patient get put back on his prednisone - rx [facility] [place]. She can be reached at [phone]

## 2018-01-07 NOTE — Telephone Encounter (Signed)
Spoke with April, pt's daughter  She states that pt would like to resume his pred since the bronch was cancelled  Is this okay? If so, what dose? Please advise, thanks!

## 2018-01-08 NOTE — Telephone Encounter (Signed)
Patient's daughter, April, calling back to see if prednisone has been called in.  Advise the message has been sent to Dr. Marchelle Gearingamaswamy who is working at the hospital this week.  She states patient's cough yesterday was worse and she is concerned and would like to try to get this under control as soon as possible so patient can then schedule bronch.  April's CB is [phone].

## 2018-01-09 NOTE — Telephone Encounter (Signed)
Re-routing message to Dr. Marchelle Gearingamaswamy and will also page him so he can know about the encounter.

## 2018-01-13 MED ORDER — PREDNISONE 5 MG PO TABS: 5.0000 mg | ORAL_TABLET | Freq: Every day | ORAL | 2 refills | Status: DC

## 2018-01-13 NOTE — Telephone Encounter (Signed)
Unable to speak with pt's daughter, April as [place]. Pt is requesting to start prednisone, as discussed at last OV. Pt states MR mentioned waiting to start prednisone until bronch was performed. Pt has decided to wait on bronch at this point.  Pt reports of presistant cough.   MR please advise. Thanks

## 2018-01-13 NOTE — Telephone Encounter (Signed)
Spoke with MR who stated due to pt wanting to hold off on the bronch at this point, we could send a prednisone Rx for him to take 5mg  prednisone daily.  Called pt letting him know the script was being sent in to his pharmacy.  Pt expressed understanding.   Nothing further needed at this time.

## 2018-01-13 NOTE — Telephone Encounter (Signed)
April, daughter, calling to check on prednisone RX for the patient.  CB is [phone].  Requesting call back.

## 2018-01-21 ENCOUNTER — Telehealth: Payer: Self-pay | Admitting: Internal Medicine

## 2018-01-21 NOTE — Telephone Encounter (Signed)
Called pt to see if he was ready for us to schedule him to have a bronch done in May.  Pt stated to me at this time he wanted to still hold off.  Pt is scheduled to come in to see MR 01/28/18 so we will keep that appt and go from that appt to see if he wanted to schedule bronch after that appt.

## 2018-01-28 ENCOUNTER — Ambulatory Visit: Payer: Medicare HMO | Admitting: Internal Medicine

## 2018-01-28 ENCOUNTER — Encounter: Payer: Self-pay | Admitting: Internal Medicine

## 2018-01-28 ENCOUNTER — Encounter: Payer: Self-pay | Admitting: *Deleted

## 2018-01-28 VITALS — BP 124/70 | HR 78 | Ht 70.0 in | Wt 218.4 lb

## 2018-01-28 DIAGNOSIS — Z77120 Contact with and (suspected) exposure to mold (toxic): Secondary | ICD-10-CM

## 2018-01-28 DIAGNOSIS — J849 Interstitial pulmonary disease, unspecified: Secondary | ICD-10-CM

## 2018-01-28 NOTE — Patient Instructions (Addendum)
ICD-10-CM   1. ILD (interstitial lung disease) (HCC) J84.9   2. Mold exposure Z77.120     ILD stable Differential diagnosis is still IPF v Chronic HP Glad cough much better with low dose prednisone  per day Chest tightness - some always going to be there due to abdomen, fibrosis lung and back pain  - currently stable  PLAN  - continue prednisone  per day (we discussed side effects esp worsening risk for osteoporosis which you have been diagnosed with recently but agreed that beneift outweighs risk esp due to HP possibility and cough) - hold off esbriet for now  -call me [phone] once neurologist clears for bronchoscopy - then we will schedule it - in 8 weeks - Pre-bd spiro and dlco only. No lung volume or bd response. No post-bd spiro - No mold exposure ever esp in [place] home even for less than  1 minute (take letter)  Followup  8 weeks after spirometry - ILD CLinic followup

## 2018-01-28 NOTE — Progress Notes (Signed)
Subjective:     Patient ID: Rontavious Albright, male   DOB: 09-03-1956, 62 y.o.   MRN: [id number]  HPI   PCP Charlott Rakes, MD  HPI   IOV 07/22/2017  Chief Complaint  Patient presents with  . Advice Only    Referred by Marzetta Board from [place] for interstitial pulmonary fibrosis.  C/o prod. cough with white to dark brown with bloody mucus, SOB on exertion, and CP. 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 He tells me that he said insidious onset of cough for the last few years particularly worse in the last 1 year. Approximately 1 year ago he ended up getting a second vaccine of Prevnar 23 at [facility]. Apparently after this the cough started getting worse since then it is been progressive also associated with progressive shortness of breath. The cough was getting worse despitestopping ace inhibitors. Then in February2018 he had the CT scan of the chest.  cT scan of the chest without contrast done at [facility]: First of all it is unclear to me if this is a high-resolution CT scan of the chest. The report does not talk about UIP or possible UIP.the CT scan talks about "subpleural reticular opacities and scattered mild subpleural cystic changes throughout both lungs favored to represent chronic fibrosis. In additionthis reticular linear opacities involving the anterior portions of the lower lobes and lingula  And lateral left upper lobe favored to represent chronic scarring, atelectasis or fibrosis." ,. Review of the [facility] notes indicate that on 11/29/2016 Dr Kathi Ludwig did notice the presence of emphysema on the above changes and also the absence of honeycombing and use the CT scan to diagnose IPF. HIs noted July 2018 specifically states that he felt patient had bilateral  subpleural reticulation without groundglass  opacitiesdistributed in all the lobes. He also thought there was some subpleural honeycomb changing. He felt the CT scan was classic for UIP.By this time patient had seen rheumatologist as I confirmed that on the [facility] notes A diagnosis of IPF was established. Patient was started on Pirfenidone (Esbriet). He completed 8 weeks of this at this point and he is tolerating it fine. LAst LFT Sep 2018   His pulmonary function test on 06/18/2017 shows FVC 3.77 L/79.6% predicted, total lung capacity of 5.34 L/76.3% predicted and DLCO 13.15/38.4% predicted[uncorrected DLCO for hemoglobin]  Walking desaturation test 185 feet 3 laps on room air today: Resting pulse ox 99%. Final pulse ox 94%. Resting heart rate 81/m. Final heart rate 108/m   Autoimmune lab work shows 03/03/2017 CCP less than 5but his rheumatoid factor on the same day was elevated at 80  Other lab work shows an elevation in creatinine on 05/31/2007 1.4 mg percent but is in June it was 1.25 mg percent. Liver function test normal as of September 2018 He is also little bit anemic with 05/30/2017 hemoglobin 12.8 g percent which is slightly down compared to 13.4 g percent in May 2017.nuclear medicine cardiac stress test shows ejection fraction 52% with normal perfusion [facility] May    He is here with TRavis son in law both of them extremely worried about the cough and life expectancy. Asking about research trials.in particular they've done some research and stem cell trials. We discussed this extensively.   Also gives a  history of sleep apnea diagnosed several years ago but insurance did not cover his CPAP she is not using it. He tells me that he does have nocturnal desaturations as tested by his wife at home  [employer] of chest physicians interstitial lung disease questionnaire :: filled and returned the following day and entered by me on 08/01/2017  - Symptoms: cough - moste days +, severe , night +, mucus +,  hemotpysis +, x 1.5 years. Dyspnea - clas 3 - Pmhx - heard dz, stroke,   - ROS: GERD +, dry skin +, arthralgia + - Personal exposure: did used street drugs. Smoked cig age [age], 2ppd - Fam hx: COPD +, rest negative - Home - > 40 years old, There is water damage, mold, animals - bids - Occupation:  Worked for [employer],. X 16.5 years. Dust +, Insulation +, Smoke +, Crawlspace_. Also did work in [employer], Environmental education officer and Oncologist, Marine scientist, asbestos, potery, dtergent, Psychologist, forensic, brakes, malt, - Pneumptox: negative     Acute visit 08/06/17 62 year old with history of IPF, previously followed at [facility] and on [place]. Seen in consultation at [facility] on 10/23 by Dr. Chase Caller He has complaints of increasing cough with dyspnea, fever for the past 1 week.  He developed diarrhea and nausea a few days ago.  Pets: Has dogs, no birds, farm animals, exotic pets Occupation: Works for [employer] with exposure to dust, installation, smoke, possible asbestos. Exposures: Has significant issues with mold from a roof leak for the past 2 years. No jacuzzi, hot tubs. Smoking history: 55-pack-year.  Quit in 2003 Travel History: Not significant   OV 08/27/2017  Chief Complaint  Patient presents with  . Follow-up    Pt is currently on Esbriet. Still has complaints of a cough with mild SOB with exertion.     FU ILD - clinical dx of IPF at [facility]. Now under care at [place] [facility] [place]. Workup in Progress    Presents with daughter April, Anne and her husband Darnelle Maffucci. Wife not present.  Here to review ILD workup for diagnosis and management. They have lot of questions. Etiologic workup so far: Hx revealed significant mold exposure at  Home x 2 year (they are tryhing to get rid of it). He is also reporting recurrent respiratory exacerbation needing prednisone. Daughter thinks atleast 4 times this  Year. Autoimmune panel (below) negative except RF. VAsculitis panel negative. HP panel  negative HRCT read by Dr Rosario Jacks and I d/;w her via email again today and personally saw CT and agree with findings: Old ATS possible UIP. New ATS: indeterminate v probable -> due to lack of honeycombing and nothing beyond mild traction bronchieclectasis. There is associaed emphysema though  Severity: he seems more hampered by his obesity and pain issues. ONO - 10/27 desatured for 160 min. Last visit did not desaturate with walking  Co morbidity:  He does have gerd under control. Has CAD on plavix but 2017 stress test was normal. He has had stroke before and has mild carotid plaque per hx  Therapy: currently in esbriet since sept 2017 and tolerating well. Does have nocturia but probasbly associated with increased water and coffee in day time   IMPRESSION: HRCT Lungs/Pleura: Paraseptal emphysema. Somewhat basilar predominant pattern of subpleural reticulation, ground-glass and mild traction bronchiolectasis. No definite honeycombing. No air trapping. No pleural fluid. Airway is unremarkable. 1. Pulmonary parenchymal pattern of fibrosis may be due to fibrotic nonspecific interstitial pneumonitis or usual interstitial pneumonitis (  ATS criteria = possible UIP). 2.  Aortic atherosclerosis (ICD10-170.0). 3.  Emphysema (ICD10-J43.9). 4. Cholelithiasis.   Electronically Signed   By: Lorin Picket M.D.   On: 07/28/2017 15:24   Results for DONTELL, MIAN (MRN [id number]) as of 08/27/2017 12:18  Ref. Range 07/23/2017 09:05  Anit Nuclear Antibody(ANA) Latest Ref Range: NEGATIVE  NEGATIVE  ANCA SCREEN Latest Ref Range: Negative  Negative  Angiotensin-Converting Enzyme Latest Ref Range: 9 - 67 U/L 38  Cyclic Citrullin Peptide Ab Latest Units: UNITS <16  ds DNA Ab Latest Units: IU/mL 2  Myeloperoxidase Abs Latest Units: AI <1.0  Serine Protease 3 Latest Units: AI <1.0  RA Latex Turbid. Latest Ref Range: <14 IU/mL 119 (H)  SSA (Ro) (ENA) Antibody, IgG Latest Ref Range: <1.0 NEG AI <1.0 NEG   SSB (La) (ENA) Antibody, IgG Latest Ref Range: <1.0 NEG AI <1.0 NEG  Scleroderma (Scl-70) (ENA) Antibody, IgG Latest Ref Range: <1.0 NEG AI <1.0 NEG  Results for KADAR, CHANCE (MRN [id number]) as of 08/27/2017 12:18  Ref. Range 08/13/2017 11:21  Candida albicans Latest Units: kU/L <0.10  IgE (Immunoglobulin E), Serum Latest Ref Range: <OR=114 kU/L 13  A.Fumigatus #1 Abs Latest Ref Range: Negative  Negative  Micropolyspora faeni, IgG Latest Ref Range: Negative  Negative  Thermoactinomyces vulgaris, IgG Latest Ref Range: Negative  Negative  A. Pullulans Abs Latest Ref Range: Negative  Negative  Thermoact. Saccharii Latest Ref Range: Negative  Negative  Pigeon Serum Abs Latest Ref Range: Negative  Negative     OV 11/18/2017  Chief Complaint  Patient presents with  . Follow-up    Seen by Dr. Jennye Moccasin presents for follow-up.  Working diagnosis is idiopathic pulmonary fibrosis established at [facility].  After I met him I realized that he has significant mold exposure.  His high-resolution CT scan chest done with US showed possible UIP thus lowering the probability that this is in fact UIP.  I sent him to Dr. Roxan Hockey for surgical lung biopsy evaluation.  However in the interim he admitted at [facility] with intracranial hemorrhage according to his history.  He spent a total of 3 weeks between intensive care and inpatient rehab.  He has been discharged with just mild residual defect of mild vision loss in his left eye and a very mild weakness on his left side.  During this time at the hospitalization his Pirfenidone (Esbriet) was held because of "chronic kidney disease".  He says that he was not aware of this diagnosis until after his discharge from the hospital.  His Plavix is on hold is only taking aspirin.  He believes the reason for the intracranial hemorrhage was just hypertension.  At this point in time he is no longer in the house where he has  significant mold exposure.  He had an extensive home inspection done.  He showed me the paperwork for this.  There is definite positive mold exposure in the house.  He is living with his daughter now where there is no mold exposure and he feels because of this his respiratory status is improved.  Occasionally still goes into his house to fetch his computers and at this time he starts coughing and wheezing significantly.  He wants a letter wondering if his pulmonary fibrosis interstitial lung disease is related to the mold exposure.  There are no other new issues.  Review of his chemistry results show that in June 2018 at [facility]: His creatinine is 1.25  mg percent GFR greater than 60.  But in August 2018 his creatinine is 1.4 mg percent with a GFR of 51.  Then at [facility] his creatinine was 1.1 mg percent with a GFR of 72. So was all ok    OV 12/30/2017  Chief Complaint  Patient presents with  . Follow-up    SOB, increased coughing, esbriet still on hold    Follow-up interstitial lung disease with mold exposure [differential diagnosis of IPF versus chronic hypersensitivity pneumonitis] with high-resolution CT chest October showing mixed emphysema and possible UIP 2018 mold and autoimmune panel negative  He is here for follow-up.  At this visit it was supposed to be how he was doing with pirfenidone.  At last visit he expressed concern about renal insufficiency so we checked his creatinine and this was normal.  So we told him to restart his pirfenidone but he did not.  His daughter April who is here with him tells me that they are worried about starting pirfenidone because of GI side effects and his other comorbidities.  They seem more interested in ruling out hypersensitivity pneumonitis but at the same time that do not want surgical lung biopsy.  At this point in time he still off Plavix.  Overall he is stable although 3 weeks [place] and then he had a worsening cough.   Saw primary care physician had 2 rounds of antibiotics and 1 5-day prednisone.  But the cough has not improved.  It is dry and he feels like he has bronchitic symptoms.  He is open to having a bronchoalveolar lavage to look for cell count to differentiate UIP versus hypersensitivity pneumonitis.   Walking desaturation test on 12/30/2017 185 feet x 3 laps on ROOM AIR:  did NOT desaturate. Rest pulse ox was 98%, final pulse ox was 94%. HR response 68/min at rest to 94/min at peak exertion. Patient Diandre Merica  Did nto Desaturate < 88% . Gavin Potters yes did  Desaturated </= 3% points. Wren Gallaga es did get tachyardic    OV 01/28/2018  Chief Complaint  Patient presents with  . Follow-up    Pt states he went to his doctor yesterday and was told he had some rattling in both lungs. States breathing is about the same and still has a mild cough but is better with the prednisone.    Follow-up interstitial lung disease with mold exposure [differential diagnosis of IPF versus chronic hypersensitivity pneumonitis] with high-resolution CT chest October showing mixed emphysema and probable UIP 2018 (mild progression Feb 2018 -> oct 2018). Mold and autoimmune panel negative   Violeta Gelinas present for followup for above. Daughter April here with her. Since last visit - Per Dr Rosario Jacks radiologist emial 12/31/17 - [place] FEb 2018 (now uploaded) -> CT Oct 2018 at [facility] : mild progression but hard to tell. And is Probable UIP baed on 2018 ATS criteria. WE set up bronch BAL to ddx UIP v chronic HP but he canceled it. He tells me neurologist advised to hold off. He is only on aspirin (off plavix). SO over phone at his reqiest we started daily low dose prednisone '5mg'$  forough  AT this point in time he tells me cough is much much better. Only mild cough. Daughter tells in interim has dx of osteoporosis and L/T spine fracture. Nevertheless they prefer prednisone. He is still interested in bronch BAL but only after neuro  clears him. He has esbriet at home  but holding off till after Bronch BAL. He has new issue of chest tightness that he is prproting when he is lying down but then is chronic issue   Results for LORIMER, TIBERIO (MRN [id number]) as of 11/18/2017 11:38  Ref. Range 11/18/2017 10:17  FVC-Pre Latest Units: L 3.60  FVC-%Pred-Pre Latest Units: % 76   Results for AHAD, COLARUSSO (MRN [id number]) as of 11/18/2017 11:38  Ref. Range 11/18/2017 10:17  DLCO unc Latest Units: ml/min/mmHg 12.85  DLCO unc % pred Latest Units: % 39     has a past medical history of Angina pectoris (HCC), Bilateral low back pain with left-sided sciatica, Bronchitis, chronic (HCC), Chest pain, Chronic bronchitis (HCC), Chronic cough, DDD (degenerative disc disease), lumbar, Elevated troponin, GERD (gastroesophageal reflux disease), Hyperlipidemia, Hypertension, Insomnia, IPF (idiopathic pulmonary fibrosis) ([place]), Lumbar facet arthropathy, Neuropathic pain, Non morbid obesity due to excess calories, Poor tolerance for activity, Sleep apnea, and Stroke ([place]).   reports that he quit smoking about 16 years ago. His smoking use included cigarettes. He has a 54.00 pack-year smoking history. He has never used smokeless tobacco.  Past Surgical History:  Procedure Laterality Date  . triple bypass surgery      Allergies  Allergen Reactions  . Penicillins Shortness Of Breath, Swelling and Other (See Comments)    Has patient had a PCN reaction causing immediate rash, facial/tongue/throat swelling, SOB or lightheadedness with hypotension: No Has patient had a PCN reaction causing severe rash involving mucus membranes or skin necrosis: No Has patient had a PCN reaction that required hospitalization: Yes Has patient had a PCN reaction occurring within the last 10 years: No If all of the above answers are "NO", then may proceed with Cephalosporin use.   . Benadryl [Diphenhydramine] Hives    Immunization History  Administered Date(s)  Administered  . Pneumococcal Conjugate-13 10/04/2013  . Pneumococcal Polysaccharide-23 01/31/2016    Family History  Problem Relation Age of Onset  . Alzheimer's disease Mother   . Leukemia Father   . Lung cancer Sister      Current Outpatient Medications:  .  albuterol (PROVENTIL HFA;VENTOLIN HFA) 108 (90 Base) MCG/ACT inhaler, Inhale 2 puffs into the lungs 2 (two) times daily. , Disp: , Rfl:  .  aspirin EC 325 MG tablet, Take 325 mg by mouth daily. , Disp: , Rfl:  .  benzonatate (TESSALON) 100 MG capsule, Take 100 mg by mouth every 8 hours as needed for cough, Disp: , Rfl: 0 .  budesonide-formoterol (SYMBICORT) 80-4.5 MCG/ACT inhaler, Inhale 2 puffs into the lungs 2 (two) times daily. , Disp: , Rfl:  .  cyclobenzaprine (FLEXERIL) 10 MG tablet, Take 10 mg by mouth 3 (three) times daily as needed for muscle spasms. , Disp: , Rfl:  .  docusate sodium (COLACE) 100 MG capsule, Take 100 mg by mouth 2 (two) times daily., Disp: , Rfl: 5 .  esomeprazole (NEXIUM) 40 MG capsule, Take 40 mg by mouth., Disp: , Rfl:  .  furosemide (LASIX) 40 MG tablet, Take 40 mg by mouth daily as needed for fluid. , Disp: , Rfl:  .  gabapentin (NEURONTIN) 400 MG capsule, Take 400 mg by mouth 3 (three) times daily. , Disp: , Rfl:  .  isosorbide mononitrate (IMDUR) 30 MG 24 hr tablet, Take 30 mg by mouth daily. , Disp: , Rfl:  .  ketoconazole (NIZORAL) 2 % shampoo, Apply 1 application topically every other day. , Disp: , Rfl:  .  lamoTRIgine (LAMICTAL) 25 MG tablet, Take  25 mg by mouth daily. , Disp: , Rfl:  .  methocarbamol (ROBAXIN) 500 MG tablet, Take 500 mg by mouth daily. , Disp: , Rfl:  .  metoprolol succinate (TOPROL-XL) 50 MG 24 hr tablet, Take 50 mg by mouth 2 (two) times daily. , Disp: , Rfl:  .  nitroGLYCERIN (NITROSTAT) 0.4 MG SL tablet, Place 0.4 mg under the tongue every 5 (five) minutes as needed for chest pain. , Disp: , Rfl:  .  oxyCODONE-acetaminophen (PERCOCET) 10-325 MG tablet, TAKE 1 TABLET BY  MOUTH EVERY 12 (TWELVE) HOURS AS NEEDED FOR UP TO 14 DAYS., Disp: , Rfl: 0 .  polyethylene glycol (MIRALAX / GLYCOLAX) packet, Take 17 g by mouth daily as needed for moderate constipation. , Disp: , Rfl:  .  predniSONE (DELTASONE) 5 MG tablet, Take 1 tablet (5 mg total) by mouth daily with breakfast., Disp: 60 tablet, Rfl: 2 .  ranolazine (RANEXA) 500 MG 12 hr tablet, Take 500 mg by mouth 2 (two) times daily. , Disp: , Rfl:  .  rosuvastatin (CRESTOR) 20 MG tablet, Take 20 mg by mouth daily. , Disp: , Rfl:  .  traMADol (ULTRAM) 50 MG tablet, Take 50 mg by mouth every 6 (six) hours as needed for moderate pain. , Disp: , Rfl:    Review of Systems     Objective:   Physical Exam  Constitutional: He is oriented to person, place, and time. He appears well-developed and well-nourished. No distress.  HENT:  Head: Normocephalic and atraumatic.  Right Ear: External ear normal.  Left Ear: External ear normal.  Mouth/Throat: Oropharynx is clear and moist. No oropharyngeal exudate.  Eyes: Pupils are equal, round, and reactive to light. Conjunctivae and EOM are normal. Right eye exhibits no discharge. Left eye exhibits no discharge. No scleral icterus.  Neck: Normal range of motion. Neck supple. No JVD present. No tracheal deviation present. No thyromegaly present.  Cardiovascular: Normal rate, regular rhythm and intact distal pulses. Exam reveals no gallop and no friction rub.  No murmur heard. Pulmonary/Chest: Effort normal and breath sounds normal. No respiratory distress. He has no wheezes. He has no rales. He exhibits no tenderness.  L > R base crackles. Not classic UIP crackles  Abdominal: Soft. Bowel sounds are normal. He exhibits no distension and no mass. There is no tenderness. There is no rebound and no guarding.  Musculoskeletal: Normal range of motion. He exhibits no edema or tenderness.  Lymphadenopathy:    He has no cervical adenopathy.  Neurological: He is alert and oriented to person,  place, and time. He has normal reflexes. No cranial nerve deficit. Coordination normal.  Skin: Skin is warm and dry. No rash noted. He is not diaphoretic. No erythema. No pallor.  Psychiatric: He has a normal mood and affect. His behavior is normal. Judgment and thought content normal.  Nursing note and vitals reviewed.  Vitals:   01/28/18 0933  BP: 124/70  Pulse: 78  SpO2: 93%  Weight: 218 lb 6.4 oz (99.1 kg)  Height: '5\' 10"'$  (1.778 m)   Estimated body mass index is 31.34 kg/m as calculated from the following:   Height as of this encounter: '5\' 10"'$  (1.778 m).   Weight as of this encounter: 218 lb 6.4 oz (99.1 kg).     Assessment:       ICD-10-CM   1. ILD (interstitial lung disease) ([place]) J84.9   2. Mold exposure Z77.120        Plan:  ILD stable Differential diagnosis is still IPF v Chronic HP Glad cough much better with low dose prednisone '5mg'$  per day Chest tightness - some always going to be there due to abdomen, fibrosis lung and back pain  - currently stable  PLAN  - continue prednisone '5mg'$  per day (we discussed side effects esp worsening risk for osteoporosis which you have been diagnosed with recently but agreed that beneift outweighs risk esp due to HP possibility and cough) - hold off esbriet for now  -call me [phone] once neurologist clears for bronchoscopy - then we will schedule it - in 8 weeks - Pre-bd spiro and dlco only. No lung volume or bd response. No post-bd spiro - No mold exposure ever esp in [place] home even for less than  1 minute (take letter)  Followup  8 weeks after spirometry - ILD CLinic followup    > 50% of this > 25 min visit spent in face to face counseling or coordination of care    Dr. Brand Males, M.D., [facility].C.P Pulmonary and Critical Care Medicine Staff Physician, [place] Director - Interstitial Lung Disease  Program  Pulmonary [place] at [place], [place], [address]  Pager: [phone], If no answer or between  15:00h - 7:00h: call [phone] Telephone: [phone]

## 2018-03-04 ENCOUNTER — Telehealth: Payer: Self-pay | Admitting: Internal Medicine

## 2018-03-04 NOTE — Telephone Encounter (Signed)
Based on most recent office note - he is at intermediate risk for pulmonary complications from spine surgery Risk is not prohibitive. REcommend inaptient stay, close monitoring and post op consideration of bipap and pulm toilet and dvt prevention    [place] Postperative Pulmonary Risk Score -for ventilator dependence >/= 3 dyas Comment Score  Type of surgery - abd ao aneurysm (27), thoracic (21), neurosurgery / upper abdominal / vascular (21), neck (11) spine 0  Emergency Surgery - (11)  22  ALbumin < 3 or poor nutritional state - (9)  0  BUN > 30 -  (8)  0  Partial or completely dependent functional status - (7)  0  COPD -  (6)  6  Age - 60 to 69 (4), > 70  (6)  4  TOTAL  26  Risk Stratifcation scores  - < 10, 11-19, 20-27, 28-40, >40  intermediate     Dr. Kalman ShanMurali Javaris Wigington, M.D., [facility]P Pulmonary and Critical Care Medicine Staff Physician, [facility] Center Director - Interstitial Lung Disease  Program  Pulmonary Fibrosis [facility] [place], [place], [id number]  Pager: [phone], If no answer or between  15:00h - 7:00h: call [phone] Telephone: [phone]

## 2018-03-04 NOTE — Telephone Encounter (Signed)
Sp[facility] with [facility] at [facility], states that pt has a 5 compression fractures in his spine. Sue Lushndrea notes that pt is in "excruciating" pain, is in need for a vertebralplasty, needs pulmonary clearance for this procedure.  Sue Lushndrea notes that pt has been previously denied sx clearance, but is requesting again d/t the increased number of fractures and pain  Needs a letter of sx clearance to be faxed to Sue LushAndrea, Dr. Anda Kraft[place]ampshire at [facility] at [phone]   Andrea's direct callback #: [phone]   MR please advise on sx clearance.  Thanks.

## 2018-03-05 NOTE — Telephone Encounter (Signed)
Sue LushAndrea, NP returning call. Per Sue LushAndrea, she is waiting on a faxed medical clearance from MR to get this pt sched for surgery. Fax Number-[phone] Phone Number [phone]

## 2018-03-05 NOTE — Telephone Encounter (Signed)
Spoke with MR. He states that his message below is sufficient enough to place on a letter for surgical clearance.   Called and spoke with Sue Lushndrea, advised that I was sending letter over now.   Letter has been typed and faxed. Nothing further needed.

## 2018-03-12 ENCOUNTER — Telehealth: Payer: Self-pay | Admitting: Internal Medicine

## 2018-03-12 NOTE — Telephone Encounter (Signed)
Owens CorningCalled Amanda, unable to reach. I left a message to have her give us a call back.

## 2018-03-13 NOTE — Telephone Encounter (Addendum)
ATC Amanda, no answer. Left message for Marchelle Folksmanda to call back.

## 2018-03-13 NOTE — Telephone Encounter (Signed)
I have left a message with Marchelle Folksmanda making her aware that we would speak with MR about possible clearing the pt for surgery based on his 01/28/18 OV.  MR - please advise. Thanks.

## 2018-03-13 NOTE — Telephone Encounter (Signed)
Marchelle Folksmanda returned call, CB is [phone].

## 2018-03-13 NOTE — Telephone Encounter (Signed)
Chase Kim returned phone call; contact # [phone]

## 2018-03-16 NOTE — Telephone Encounter (Signed)
Attempted to call Marchelle Folksmanda but unable to reach her.  Left a detailed message for Marchelle Folksmanda and asked for her to return our call as well.

## 2018-03-16 NOTE — Telephone Encounter (Signed)
Please see my preop clearance note - phone [facility] from 03/04/18 [facility]. The same rules apply for kyphoplasty. Did she not get that note back then? Or is she asking for clearance for a different surgery?

## 2018-03-17 NOTE — Telephone Encounter (Signed)
Per Marchelle FolksAmanda, [facility] has everything they need for this pt and no longer requires a call back.

## 2018-03-25 ENCOUNTER — Encounter: Payer: Self-pay | Admitting: Internal Medicine

## 2018-03-25 ENCOUNTER — Ambulatory Visit (INDEPENDENT_AMBULATORY_CARE_PROVIDER_SITE_OTHER): Payer: Medicare HMO | Admitting: Internal Medicine

## 2018-03-25 ENCOUNTER — Ambulatory Visit: Payer: Medicare HMO | Admitting: Internal Medicine

## 2018-03-25 VITALS — BP 112/68 | HR 73 | Ht 70.0 in | Wt 222.0 lb

## 2018-03-25 DIAGNOSIS — J849 Interstitial pulmonary disease, unspecified: Secondary | ICD-10-CM

## 2018-03-25 DIAGNOSIS — R05 Cough: Secondary | ICD-10-CM

## 2018-03-25 DIAGNOSIS — Z77120 Contact with and (suspected) exposure to mold (toxic): Secondary | ICD-10-CM

## 2018-03-25 DIAGNOSIS — R059 Cough, unspecified: Secondary | ICD-10-CM

## 2018-03-25 LAB — PULMONARY FUNCTION TEST
DL/VA % PRED: 55 %
DL/VA: 2.56 ml/min/mmHg/L
DLCO UNC % PRED: 45 %
DLCO UNC: 14.71 ml/min/mmHg
FEF 25-75 Pre: 3.46 L/sec
FEF2575-%Pred-Pre: 119 %
FEV1-%Pred-Pre: 73 %
FEV1-Pre: 2.6 L
FEV1FVC-%Pred-Pre: 113 %
FEV6-%PRED-PRE: 68 %
FEV6-Pre: 3.06 L
FEV6FVC-%Pred-Pre: 105 %
FVC-%PRED-PRE: 64 %
FVC-Pre: 3.06 L
PRE FEV6/FVC RATIO: 100 %
Pre FEV1/FVC ratio: 85 %

## 2018-03-25 NOTE — Progress Notes (Signed)
PFT spiro and DLCO completed 03/25/18  

## 2018-03-25 NOTE — Progress Notes (Signed)
Subjective:     Patient ID: Chase Kim, male   DOB: 1956-09-22, 62 y.o.   MRN: [id number]  HPI   PCP Charlott Rakes, MD  HPI   IOV 07/22/2017  Chief Complaint  Patient presents with  . Advice Only    Referred by Marzetta Board from [facility] for interstitial pulmonary fibrosis.  C/o prod. cough with white to dark brown with bloody mucus, SOB on exertion, and CP. 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 He tells me that he said insidious onset of cough for the last few years particularly worse in the last 1 year. Approximately 1 year ago he ended up getting a second vaccine of Prevnar 23 at [facility]. Apparently after this the cough started getting worse since then it is been progressive also associated with progressive shortness of breath. The cough was getting worse despitestopping ace inhibitors. Then in February2018 he had the CT scan of the chest.  cT scan of the chest without contrast [facility]: First of all it is unclear to me if this is a high-resolution CT scan of the chest. The report does not talk about UIP or possible UIP.the CT scan talks about "subpleural reticular opacities and scattered mild subpleural cystic changes throughout both lungs favored to represent chronic fibrosis. In additionthis reticular linear opacities involving the anterior portions of the lower lobes and lingula  And lateral left upper lobe favored to represent chronic scarring, atelectasis or fibrosis." ,. Review of the [facility] notes indicate that on 11/29/2016 Dr Kathi Ludwig did notice the presence of emphysema on the above changes and also the absence of honeycombing and use the CT scan to diagnose IPF. HIs noted July 2018 specifically states that he felt patient had bilateral  subpleural reticulation without groundglass  opacitiesdistributed in all the lobes. He also thought there was some subpleural honeycomb changing. He felt the CT scan was classic for UIP.By this time patient had seen rheumatologist as I confirmed that on the [facility] notes A diagnosis of IPF was established. Patient was started on Pirfenidone (Esbriet). He completed 8 weeks of this at this point and he is tolerating it fine. LAst LFT Sep 2018   His pulmonary function test on 06/18/2017 shows FVC 3.77 L/79.6% predicted, total lung capacity of 5.34 L/76.3% predicted and DLCO 13.15/38.4% predicted[uncorrected DLCO for hemoglobin]  Walking desaturation test 185 feet 3 laps on room air today: Resting pulse ox 99%. Final pulse ox 94%. Resting heart rate 81/m. Final heart rate 108/m   Autoimmune lab work shows 03/03/2017 CCP less than 5but his rheumatoid factor on the same day was elevated at 80  Other lab work shows an elevation in creatinine on 05/31/2007 1.4 mg percent but is in June it was 1.25 mg percent. Liver function test normal as of September 2018 He is also little bit anemic with 05/30/2017 hemoglobin 12.8 g percent which is slightly down compared to 13.4 g percent in May 2017.nuclear medicine cardiac stress test shows ejection fraction 52% with normal perfusion [facility] May    He is here with TRavis son in law both of them extremely worried about the cough and life expectancy. Asking about research trials.in particular they've done some research and stem cell trials. We discussed this extensively.   Also gives a  history of sleep apnea diagnosed several years ago but insurance did not cover his CPAP she is not using it. He tells me that he does have nocturnal desaturations as tested by his wife at home  [employer] of chest physicians interstitial lung disease questionnaire :: filled and returned the following day and entered by me on 08/01/2017  - Symptoms: cough - moste days +, severe , night +, mucus +,  hemotpysis +, x 1.5 years. Dyspnea - clas 3 - Pmhx - heard dz, stroke,   - ROS: GERD +, dry skin +, arthralgia + - Personal exposure: did used street drugs. Smoked cig age [age], 2ppd - Fam hx: COPD +, rest negative - Home - > 40 years old, There is water damage, mold, animals - bids - Occupation:  Worked for [employer],. X 16.5 years. Dust +, Insulation +, Smoke +, Crawlspace_. Also did work in [employer], Environmental education officer and Oncologist, Marine scientist, asbestos, potery, dtergent, Psychologist, forensic, brakes, malt, - Pneumptox: negative     Acute visit 08/06/17 62 year old with history of IPF, previously followed at [facility] and on [place]. Seen in consultation at [facility] on 10/23 by Dr. Chase Caller He has complaints of increasing cough with dyspnea, fever for the past 1 week.  He developed diarrhea and nausea a few days ago.  Pets: Has dogs, no birds, farm animals, exotic pets Occupation: Works for [employer] with exposure to dust, installation, smoke, possible asbestos. Exposures: Has significant issues with mold from a roof leak for the past 2 years. No jacuzzi, hot tubs. Smoking history: 55-pack-year.  Quit in 2003 Travel History: Not significant   OV 08/27/2017  Chief Complaint  Patient presents with  . Follow-up    Pt is currently on Esbriet. Still has complaints of a cough with mild SOB with exertion.     FU ILD - clinical dx of IPF at [facility]. Now under care at [place] [facility] [place]. Workup in Progress    Presents with daughter April, Anne and her husband Darnelle Maffucci. Wife not present.  Here to review ILD workup for diagnosis and management. They have lot of questions. Etiologic workup so far: Hx revealed significant mold exposure at  Home x 2 year (they are tryhing to get rid of it). He is also reporting recurrent respiratory exacerbation needing prednisone. Daughter thinks atleast 4 times this  Year. Autoimmune panel (below) negative except RF. VAsculitis panel negative. HP panel  negative HRCT read by Dr Rosario Jacks and I d/;w her via email again today and personally saw CT and agree with findings: Old ATS possible UIP. New ATS: indeterminate v probable -> due to lack of honeycombing and nothing beyond mild traction bronchieclectasis. There is associaed emphysema though  Severity: he seems more hampered by his obesity and pain issues. ONO - 10/27 desatured for 160 min. Last visit did not desaturate with walking  Co morbidity:  He does have gerd under control. Has CAD on plavix but 2017 stress test was normal. He has had stroke before and has mild carotid plaque per hx  Therapy: currently in esbriet since sept 2017 and tolerating well. Does have nocturia but probasbly associated with increased water and coffee in day time   IMPRESSION: HRCT Lungs/Pleura: Paraseptal emphysema. Somewhat basilar predominant pattern of subpleural reticulation, ground-glass and mild traction bronchiolectasis. No definite honeycombing. No air trapping. No pleural fluid. Airway is unremarkable. 1. Pulmonary parenchymal pattern of fibrosis may be due to fibrotic nonspecific interstitial pneumonitis or usual interstitial pneumonitis (  ATS criteria = possible UIP). 2.  Aortic atherosclerosis (ICD10-170.0). 3.  Emphysema (ICD10-J43.9). 4. Cholelithiasis.   Electronically Signed   By: Lorin Picket M.D.   On: 07/28/2017 15:24   Results for FINIS, HENDRICKSEN (MRN [id number]) as of 08/27/2017 12:18  Ref. Range 07/23/2017 09:05  Anit Nuclear Antibody(ANA) Latest Ref Range: NEGATIVE  NEGATIVE  ANCA SCREEN Latest Ref Range: Negative  Negative  Angiotensin-Converting Enzyme Latest Ref Range: 9 - 67 U/L 38  Cyclic Citrullin Peptide Ab Latest Units: UNITS <16  ds DNA Ab Latest Units: IU/mL 2  Myeloperoxidase Abs Latest Units: AI <1.0  Serine Protease 3 Latest Units: AI <1.0  RA Latex Turbid. Latest Ref Range: <14 IU/mL 119 (H)  SSA (Ro) (ENA) Antibody, IgG Latest Ref Range: <1.0 NEG AI <1.0 NEG   SSB (La) (ENA) Antibody, IgG Latest Ref Range: <1.0 NEG AI <1.0 NEG  Scleroderma (Scl-70) (ENA) Antibody, IgG Latest Ref Range: <1.0 NEG AI <1.0 NEG  Results for JATAVIOUS, PEPPARD (MRN [id number]) as of 08/27/2017 12:18  Ref. Range 08/13/2017 11:21  Candida albicans Latest Units: kU/L <0.10  IgE (Immunoglobulin E), Serum Latest Ref Range: <OR=114 kU/L 13  A.Fumigatus #1 Abs Latest Ref Range: Negative  Negative  Micropolyspora faeni, IgG Latest Ref Range: Negative  Negative  Thermoactinomyces vulgaris, IgG Latest Ref Range: Negative  Negative  A. Pullulans Abs Latest Ref Range: Negative  Negative  Thermoact. Saccharii Latest Ref Range: Negative  Negative  Pigeon Serum Abs Latest Ref Range: Negative  Negative     OV 11/18/2017  Chief Complaint  Patient presents with  . Follow-up    Seen by Dr. Jennye Moccasin presents for follow-up.  Working diagnosis is idiopathic pulmonary fibrosis established at [facility].  After I met him I realized that he has significant mold exposure.  His high-resolution CT scan chest done with US showed possible UIP thus lowering the probability that this is in fact UIP.  I sent him to Dr. Roxan Hockey for surgical lung biopsy evaluation.  However in the interim he admitted at [facility] with intracranial hemorrhage according to his history.  He spent a total of 3 weeks between intensive care and inpatient rehab.  He has been discharged with just mild residual defect of mild vision loss in his left eye and a very mild weakness on his left side.  During this time at the hospitalization his Pirfenidone (Esbriet) was held because of "chronic kidney disease".  He says that he was not aware of this diagnosis until after his discharge from the hospital.  His Plavix is on hold is only taking aspirin.  He believes the reason for the intracranial hemorrhage was just hypertension.  At this point in time he is no longer in the house where he has  significant mold exposure.  He had an extensive home inspection done.  He showed me the paperwork for this.  There is definite positive mold exposure in the house.  He is living with his daughter now where there is no mold exposure and he feels because of this his respiratory status is improved.  Occasionally still goes into his house to fetch his computers and at this time he starts coughing and wheezing significantly.  He wants a letter wondering if his pulmonary fibrosis interstitial lung disease is related to the mold exposure.  There are no other new issues.  Review of his chemistry results show that in June 2018 at [facility]: His creatinine is 1.25  mg percent GFR greater than 60.  But in August 2018 his creatinine is 1.4 mg percent with a GFR of 51.  Then at [facility] his creatinine was 1.1 mg percent with a GFR of 72. So was all ok    OV 12/30/2017  Chief Complaint  Patient presents with  . Follow-up    SOB, increased coughing, esbriet still on hold    Follow-up interstitial lung disease with mold exposure [differential diagnosis of IPF versus chronic hypersensitivity pneumonitis] with high-resolution CT chest October showing mixed emphysema and possible UIP 2018 mold and autoimmune panel negative  He is here for follow-up.  At this visit it was supposed to be how he was doing with pirfenidone.  At last visit he expressed concern about renal insufficiency so we checked his creatinine and this was normal.  So we told him to restart his pirfenidone but he did not.  His daughter April who is here with him tells me that they are worried about starting pirfenidone because of GI side effects and his other comorbidities.  They seem more interested in ruling out hypersensitivity pneumonitis but at the same time that do not want surgical lung biopsy.  At this point in time he still off Plavix.  Overall he is stable although 3 weeks ago [place] the [place] and then he had a worsening cough.   Saw primary care physician had 2 rounds of antibiotics and 1 5-day prednisone.  But the cough has not improved.  It is dry and he feels like he has bronchitic symptoms.  He is open to having a bronchoalveolar lavage to look for cell count to differentiate UIP versus hypersensitivity pneumonitis.   Walking desaturation test on 12/30/2017 185 feet x 3 laps on ROOM AIR:  did NOT desaturate. Rest pulse ox was 98%, final pulse ox was 94%. HR response 68/min at rest to 94/min at peak exertion. Patient Deovion Batrez  Did nto Desaturate < 88% . Gavin Potters yes did  Desaturated </= 3% points. Dartagnan Beavers es did get tachyardic    OV 01/28/2018  Chief Complaint  Patient presents with  . Follow-up    Pt states he went to his doctor yesterday and was told he had some rattling in both lungs. States breathing is about the same and still has a mild cough but is better with the prednisone.    Follow-up interstitial lung disease with mold exposure [differential diagnosis of IPF versus chronic hypersensitivity pneumonitis] with high-resolution CT chest October showing mixed emphysema and probable UIP 2018 (mild progression Feb 2018 -> oct 2018). Mold and autoimmune panel negative   Violeta Gelinas present for followup for above. Daughter April here with her. Since last visit - Per Dr Rosario Jacks radiologist emial 12/31/17 - [place] FEb 2018 (now uploaded) -> CT Oct 2018 at [facility] : mild progression but hard to tell. And is Probable UIP baed on 2018 ATS criteria. WE set up bronch BAL to ddx UIP v chronic HP but he canceled it. He tells me neurologist advised to hold off. He is only on aspirin (off plavix). SO over phone at his reqiest we started daily low dose prednisone '5mg'$  forough  AT this point in time he tells me cough is much much better. Only mild cough. Daughter tells in interim has dx of osteoporosis and L/T spine fracture. Nevertheless they prefer prednisone. He is still interested in bronch BAL but only after neuro  clears him. He has esbriet at home  but holding off till after Bronch BAL. He has new issue of chest tightness that he is prproting when he is lying down but then is chronic issue   OV 03/25/2018  Chief Complaint  Patient presents with  . Follow-up    PFT performed today.  Pt states he has been doing okay and has a complaint of a cough with white thick gluey mucus. Pt also states breathing has become slightly worse.    Follow-up interstitial lung disease with mold exposure [differential diagnosis of IPF versus chronic hypersensitivity pneumonitis] with high-resolution CT chest October showing mixed emphysema and possible Oct 2018 UIP based on 2011 criteria ; mold and autoimmune panel negative   Mr. Vana presents with his wife who I am meeting for the first time. At issue is the fact that his interstitial lung disease is possible UIP based on 2011 criteria with a differential diagnosis of chronic hypersensitivity due to moldversus IPF/UIP. He did agree to have a bronchoscopy with lavage to differentiate this but this is been put on hold due to variety of issues regarding his past medical history.This visit is supposed to discuss bronchoscopy again but in the interim he had an acute flare up of his chronic back compression fractures and underwent status post kyphoplasty. His pain is better but he is healing from it. Therefore he wants to put offthe bronchoscopy until at least next visit. Overall he feels stable from a lung health standpoint. He had Pulmicort function testing that shows a significant decline in FVC but shows the DLCO to be stable/improved. Subjectively he feels stable. Simple walking desaturation test appears stablethough he has a tendency to desaturate. He is not being exposed to the mold and is old home anymore and this helped some. The prednisone he is on is for chronic cough because of the fibrotic lung disease and this is helping her cough significantly. However he still does have  some white mucous.   Results for MACLANE, HOLLORAN (MRN [id number]) as of 03/25/2018 09:40  Ref. Range 11/18/2017 10:17 03/25/2018 08:50  FVC-Pre Latest Units: L 3.60 3.06  FVC-%Pred-Pre Latest Units: % 76 64  Results for RISHIKESH, KHACHATRYAN (MRN [id number]) as of 03/25/2018 09:40  Ref. Range 11/18/2017 10:17 03/25/2018 08:50  DLCO unc Latest Units: ml/min/mmHg 12.85 14.71  DLCO unc % pred Latest Units: % 39 45    Simple office walk 185 feet x  3 laps goal with forehead probe 03/25/2018   O2 used Room air  Number laps completed 3  Comments about pace normal  Resting Pulse Ox/HR 95% and 71/min  Final Pulse Ox/HR 90% and 98/min  Desaturated </= 88% no  Desaturated <= 3% points yes  Got Tachycardic >/= 90/min yes  Symptoms at end of test no  Miscellaneous comments Half way went down to 88% but with deep breath better      has a past medical history of Angina pectoris ([place]), Bilateral low back pain with left-sided sciatica, Bronchitis, chronic (HCC), Chest pain, Chronic bronchitis (HCC), Chronic cough, DDD (degenerative disc disease), lumbar, Elevated troponin, GERD (gastroesophageal reflux disease), Hyperlipidemia, Hypertension, Insomnia, IPF (idiopathic pulmonary fibrosis) ([place]), Lumbar facet arthropathy, Neuropathic pain, Non morbid obesity due to excess calories, Poor tolerance for activity, Sleep apnea, and Stroke ([place]).   reports that he quit smoking about 16 years ago. His smoking use included cigarettes. He has a 54.00 pack-year smoking history. He has never used smokeless tobacco.  Past Surgical History:  Procedure Laterality Date  . triple bypass  surgery      Allergies  Allergen Reactions  . Penicillins Shortness Of Breath, Swelling and Other (See Comments)    Has patient had a PCN reaction causing immediate rash, facial/tongue/throat swelling, SOB or lightheadedness with hypotension: No Has patient had a PCN reaction causing severe rash involving mucus membranes or skin  necrosis: No Has patient had a PCN reaction that required hospitalization: Yes Has patient had a PCN reaction occurring within the last 10 years: No If all of the above answers are "NO", then may proceed with Cephalosporin use.   . Benadryl [Diphenhydramine] Hives    Immunization History  Administered Date(s) Administered  . Pneumococcal Conjugate-13 10/04/2013  . Pneumococcal Polysaccharide-23 10/04/2013, 01/31/2016    Family History  Problem Relation Age of Onset  . Alzheimer's disease Mother   . Leukemia Father   . Lung cancer Sister      Current Outpatient Medications:  .  albuterol (PROVENTIL HFA;VENTOLIN HFA) 108 (90 Base) MCG/ACT inhaler, Inhale 2 puffs into the lungs 2 (two) times daily. , Disp: , Rfl:  .  aspirin EC 325 MG tablet, Take 325 mg by mouth daily. , Disp: , Rfl:  .  benzonatate (TESSALON) 100 MG capsule, Take 100 mg by mouth every 8 hours as needed for cough, Disp: , Rfl: 0 .  budesonide-formoterol (SYMBICORT) 80-4.5 MCG/ACT inhaler, Inhale 2 puffs into the lungs 2 (two) times daily. , Disp: , Rfl:  .  docusate sodium (COLACE) 100 MG capsule, Take 100 mg by mouth 2 (two) times daily., Disp: , Rfl: 5 .  esomeprazole (NEXIUM) 40 MG capsule, Take 40 mg by mouth., Disp: , Rfl:  .  furosemide (LASIX) 40 MG tablet, Take 40 mg by mouth daily as needed for fluid. , Disp: , Rfl:  .  gabapentin (NEURONTIN) 400 MG capsule, Take 400 mg by mouth 3 (three) times daily. , Disp: , Rfl:  .  isosorbide mononitrate (IMDUR) 30 MG 24 hr tablet, Take 30 mg by mouth daily. , Disp: , Rfl:  .  ketoconazole (NIZORAL) 2 % shampoo, Apply 1 application topically every other day. , Disp: , Rfl:  .  lamoTRIgine (LAMICTAL) 25 MG tablet, Take 25 mg by mouth daily. , Disp: , Rfl:  .  methocarbamol (ROBAXIN) 500 MG tablet, Take 500 mg by mouth daily. , Disp: , Rfl:  .  metoprolol succinate (TOPROL-XL) 50 MG 24 hr tablet, Take 50 mg by mouth 2 (two) times daily. , Disp: , Rfl:  .  morphine (MSIR)  30 MG tablet, Take by mouth., Disp: , Rfl:  .  oxyCODONE-acetaminophen (PERCOCET) 10-325 MG tablet, TAKE 1 TABLET BY MOUTH EVERY 12 (TWELVE) HOURS AS NEEDED FOR UP TO 14 DAYS., Disp: , Rfl: 0 .  polyethylene glycol (MIRALAX / GLYCOLAX) packet, Take 17 g by mouth daily as needed for moderate constipation. , Disp: , Rfl:  .  predniSONE (DELTASONE) 5 MG tablet, Take 1 tablet (5 mg total) by mouth daily with breakfast., Disp: 60 tablet, Rfl: 2 .  ranolazine (RANEXA) 500 MG 12 hr tablet, Take 500 mg by mouth 2 (two) times daily. , Disp: , Rfl:  .  rosuvastatin (CRESTOR) 20 MG tablet, Take 20 mg by mouth daily. , Disp: , Rfl:  .  cyclobenzaprine (FLEXERIL) 10 MG tablet, Take 10 mg by mouth 3 (three) times daily as needed for muscle spasms. , Disp: , Rfl:  .  nitroGLYCERIN (NITROSTAT) 0.4 MG SL tablet, Place 0.4 mg under the tongue every 5 (five) minutes  as needed for chest pain. , Disp: , Rfl:    Review of Systems     Objective:   Physical Exam  Constitutional: He is oriented to person, place, and time. He appears well-developed and well-nourished. No distress.  HENT:  Head: Normocephalic and atraumatic.  Right Ear: External ear normal.  Left Ear: External ear normal.  Mouth/Throat: Oropharynx is clear and moist. No oropharyngeal exudate.  Be=ard  Eyes: Pupils are equal, round, and reactive to light. Conjunctivae and EOM are normal. Right eye exhibits no discharge. Left eye exhibits no discharge. No scleral icterus.  Neck: Normal range of motion. Neck supple. No JVD present. No tracheal deviation present. No thyromegaly present.  Cardiovascular: Normal rate, regular rhythm and intact distal pulses. Exam reveals no gallop and no friction rub.  No murmur heard. Pulmonary/Chest: Effort normal. No respiratory distress. He has no wheezes. He has rales. He exhibits no tenderness.  Abdominal: Soft. Bowel sounds are normal. He exhibits no distension and no mass. There is no tenderness. There is no  rebound and no guarding.  Visceral obesity   Musculoskeletal: Normal range of motion. He exhibits no edema or tenderness.  Lymphadenopathy:    He has no cervical adenopathy.  Neurological: He is alert and oriented to person, place, and time. He has normal reflexes. No cranial nerve deficit. Coordination normal.  Skin: Skin is warm and dry. No rash noted. He is not diaphoretic. No erythema. No pallor.  Psychiatric: He has a normal mood and affect. His behavior is normal. Judgment and thought content normal.  Nursing note and vitals reviewed.  Today's Vitals   03/25/18 0933  BP: 112/68  Pulse: 73  SpO2: 91%  Weight: 222 lb (100.7 kg)  Height: _0  (1.778 m)    Estimated body mass index is 31.85 kg/m as calculated from the following:   Height as of this encounter: _1  (1.778 m).   Weight as of this encounter: 222 lb (100.7 kg).     Assessment:       ICD-10-CM   1. ILD (interstitial lung disease) ([place]) J84.9   2. Mold exposure Z77.120   3. Cough R05        Plan:      Overall you appear stable based on symptoms and mixed results and the pulmonary function test  Glad prednisone low dose Is helping cough but mucus remains  - you understand the risks to the bone with prednisone and you have accepted the risk  Understand you still interested in bronch bal to differentiate more related chronic hypersensitivity pneumonitis versus UIP/IPF - youdo want to hold off on this for the time beingbecause of back issues  Understand that based on the bronchoscopy will, due to anti-fibrotic therapy versus therapy for hypersensitivity pneumonitis which might involve increase prednisone or other medications  Plan  - start mucinex otc - continue prednisone 5 mg per day  Follow-up  -2 months in the interstitial lung disease clinic to discuss readiness for bronchoscopy; do simple walk test at that ime   > 50% of this > 25 min visit spent in face to face counseling or coordination of  care - by this undersigned MD - Dr Brand Males. This includes one or more of the following documented above: discussion of test results, diagnostic or treatment recommendations, prognosis, risks and benefits of management options, instructions, education, compliance or risk-factor reduction   Dr. Brand Males, M.D., [facility].C.P Pulmonary and Critical Care Medicine Staff Physician, [facility] Director -  Interstitial Lung Disease  Program  Pulmonary [place] at [place], [place], [address]  Pager: [phone], If no answer or between  15:00h - 7:00h: call [phone] Telephone: [phone]

## 2018-03-25 NOTE — Patient Instructions (Addendum)
ICD-10-CM   1. ILD (interstitial lung disease) (HCC) J84.9   2. Mold exposure Z77.120   3. Cough R05     Overall you appear stable based on symptoms and mixed results and the pulmonary function test  Glad prednisone low dose Is helping cough but mucus remains  - you understand the risks to the bone with prednisone and you have accepted the risk  Understand you still interested in bronch bal to differentiate more related chronic hypersensitivity pneumonitis versus UIP/IPF - youdo want to hold off on this for the time beingbecause of back issues  Understand that based on the bronchoscopy will, due to anti-fibrotic therapy versus therapy for hypersensitivity pneumonitis which might involve increase prednisone or other medications  Plan  - start mucinex otc - continue prednisone 5 mg per day  Follow-up  -2 months in the interstitial lung disease clinic to discuss readiness for bronchoscopy; do simple walk test at that time

## 2018-05-18 ENCOUNTER — Ambulatory Visit: Payer: Medicare HMO | Admitting: Internal Medicine

## 2018-05-18 ENCOUNTER — Encounter: Payer: Self-pay | Admitting: Internal Medicine

## 2018-05-18 VITALS — BP 112/78 | HR 76

## 2018-05-18 DIAGNOSIS — J849 Interstitial pulmonary disease, unspecified: Secondary | ICD-10-CM

## 2018-05-18 DIAGNOSIS — Z77120 Contact with and (suspected) exposure to mold (toxic): Secondary | ICD-10-CM

## 2018-05-18 NOTE — Progress Notes (Signed)
Subjective:     Patient ID: Hudsyn Champine, male   DOB: 06-29-1956, 62 y.o.   MRN: [id number]  HPI    PCP Charlott Rakes, MD  HPI   IOV 07/22/2017  Chief Complaint  Patient presents with  . Advice Only    Referred by Marzetta Board from [place] for interstitial pulmonary fibrosis.  C/o prod. cough with white to dark brown with bloody mucus, SOB on exertion, and CP. 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 He tells me that he said insidious onset of cough for the last few years particularly worse in the last 1 year. Approximately 1 year ago he ended up getting a second vaccine of Prevnar 23 at [facility]. Apparently after this the cough started getting worse since then it is been progressive also associated with progressive shortness of breath. The cough was getting worse despitestopping ace inhibitors. Then in February2018 he had the CT scan of the chest.  cT scan of the chest without contrast [facility]: First of all it is unclear to me if this is a high-resolution CT scan of the chest. The report does not talk about UIP or possible UIP.the CT scan talks about "subpleural reticular opacities and scattered mild subpleural cystic changes throughout both lungs favored to represent chronic fibrosis. In additionthis reticular linear opacities involving the anterior portions of the lower lobes and lingula  And lateral left upper lobe favored to represent chronic scarring, atelectasis or fibrosis." ,. Review of the [facility] notes indicate that on 11/29/2016 Dr Kathi Ludwig did notice the presence of emphysema on the above changes and also the absence of honeycombing and use the CT scan to diagnose IPF. HIs noted July 2018 specifically states that he felt patient had bilateral  subpleural reticulation without groundglass  opacitiesdistributed in all the lobes. He also thought there was some subpleural honeycomb changing. He felt the CT scan was classic for UIP.By this time patient had seen rheumatologist as I confirmed that on the [facility] notes A diagnosis of IPF was established. Patient was started on Pirfenidone (Esbriet). He completed 8 weeks of this at this point and he is tolerating it fine. LAst LFT Sep 2018   His pulmonary function test on 06/18/2017 shows FVC 3.77 L/79.6% predicted, total lung capacity of 5.34 L/76.3% predicted and DLCO 13.15/38.4% predicted[uncorrected DLCO for hemoglobin]  Walking desaturation test 185 feet 3 laps on room air today: Resting pulse ox 99%. Final pulse ox 94%. Resting heart rate 81/m. Final heart rate 108/m   Autoimmune lab work shows 03/03/2017 CCP less than 5but his rheumatoid factor on the same day was elevated at 80  Other lab work shows an elevation in creatinine on 05/31/2007 1.4 mg percent but is in June it was 1.25 mg percent. Liver function test normal as of September 2018 He is also little bit anemic with 05/30/2017 hemoglobin 12.8 g percent which is slightly down compared to 13.4 g percent in May 2017.nuclear medicine cardiac stress test shows ejection fraction 52% with normal perfusion [facility] May    He is here with TRavis son in law both of them extremely worried about the cough and life expectancy. Asking about research trials.in particular they've done some research and stem cell trials. We discussed this extensively.   Also gives  a history of sleep apnea diagnosed several years ago but insurance did not cover his CPAP she is not using it. He tells me that he does have nocturnal desaturations as tested by his wife at home  [employer] of chest physicians interstitial lung disease questionnaire :: filled and returned the following day and entered by me on 08/01/2017  - Symptoms: cough - moste days +, severe , night +, mucus +,  hemotpysis +, x 1.5 years. Dyspnea - clas 3 - Pmhx - heard dz, stroke,   - ROS: GERD +, dry skin +, arthralgia + - Personal exposure: did used street drugs. Smoked cig age [age], 2ppd - Fam hx: COPD +, rest negative - Home - > 34 years old, There is water damage, mold, animals - bids - Occupation:  Worked for [employer],. X 16.5 years. Dust +, Insulation +, Smoke +, Crawlspace_. Also did work in [employer], Environmental education officer and Oncologist, Marine scientist, asbestos, potery, dtergent, Psychologist, forensic, brakes, malt, - Pneumptox: negative     Acute visit 08/06/17 62 year old with history of IPF, previously followed at [facility] and on [place]. Seen in consultation at [facility] on 10/23 by Dr. Chase Caller He has complaints of increasing cough with dyspnea, fever for the past 1 week.  He developed diarrhea and nausea a few days ago.  Pets: Has dogs, no birds, farm animals, exotic pets Occupation: Works for [employer] with exposure to dust, installation, smoke, possible asbestos. Exposures: Has significant issues with mold from a roof leak for the past 2 years. No jacuzzi, hot tubs. Smoking history: 55-pack-year.  Quit in 2003 Travel History: Not significant   OV 08/27/2017  Chief Complaint  Patient presents with  . Follow-up    Pt is currently on Esbriet. Still has complaints of a cough with mild SOB with exertion.     FU ILD - clinical dx of IPF at [facility]. Now under care at [facility]. Workup in Progress    Presents with daughter April, Anne and her husband Darnelle Maffucci. Wife not present.  Here to review ILD workup for diagnosis and management. They have lot of questions. Etiologic workup so far: Hx revealed significant mold exposure at  Home x 2 year (they are tryhing to get rid of it). He is also reporting recurrent respiratory exacerbation needing prednisone. Daughter thinks atleast 4 times this  Year. Autoimmune panel (below) negative except RF. VAsculitis panel negative. HP panel  negative HRCT read by Dr Rosario Jacks and I d/;w her via email again today and personally saw CT and agree with findings: Old ATS possible UIP. New ATS: indeterminate v probable -> due to lack of honeycombing and nothing beyond mild traction bronchieclectasis. There is associaed emphysema though  Severity: he seems more hampered by his obesity and pain issues. ONO - 10/27 desatured for 160 min. Last visit did not desaturate with walking  Co morbidity:  He does have gerd under control. Has CAD on plavix but 2017 stress test was normal. He has had stroke before and has mild carotid plaque per hx  Therapy: currently in esbriet since sept 2017 and tolerating well. Does have nocturia but probasbly associated with increased water and coffee in day time   IMPRESSION: HRCT Lungs/Pleura: Paraseptal emphysema. Somewhat basilar predominant pattern of subpleural reticulation, ground-glass and mild traction bronchiolectasis. No definite honeycombing. No air trapping. No pleural fluid. Airway is unremarkable. 1. Pulmonary parenchymal pattern of fibrosis may be due to fibrotic nonspecific interstitial pneumonitis or usual interstitial  pneumonitis (ATS criteria = possible UIP). 2.  Aortic atherosclerosis (ICD10-170.0). 3.  Emphysema (ICD10-J43.9). 4. Cholelithiasis.   Electronically Signed   By: Lorin Picket M.D.   On: 07/28/2017 15:24   Results for AIMAR, BORGHI (MRN [id number]) as of 08/27/2017 12:18  Ref. Range 07/23/2017 09:05  Anit Nuclear Antibody(ANA) Latest Ref Range: NEGATIVE  NEGATIVE  ANCA SCREEN Latest Ref Range: Negative  Negative  Angiotensin-Converting Enzyme Latest Ref Range: 9 - 67 U/L 38  Cyclic Citrullin Peptide Ab Latest Units: UNITS <16  ds DNA Ab Latest Units: IU/mL 2  Myeloperoxidase Abs Latest Units: AI <1.0  Serine Protease 3 Latest Units: AI <1.0  RA Latex Turbid. Latest Ref Range: <14 IU/mL 119 (H)  SSA (Ro) (ENA) Antibody, IgG Latest Ref Range: <1.0 NEG AI <1.0 NEG   SSB (La) (ENA) Antibody, IgG Latest Ref Range: <1.0 NEG AI <1.0 NEG  Scleroderma (Scl-70) (ENA) Antibody, IgG Latest Ref Range: <1.0 NEG AI <1.0 NEG  Results for JAIMERE, FEUTZ (MRN [id number]) as of 08/27/2017 12:18  Ref. Range 08/13/2017 11:21  Candida albicans Latest Units: kU/L <0.10  IgE (Immunoglobulin E), Serum Latest Ref Range: <OR=114 kU/L 13  A.Fumigatus #1 Abs Latest Ref Range: Negative  Negative  Micropolyspora faeni, IgG Latest Ref Range: Negative  Negative  Thermoactinomyces vulgaris, IgG Latest Ref Range: Negative  Negative  A. Pullulans Abs Latest Ref Range: Negative  Negative  Thermoact. Saccharii Latest Ref Range: Negative  Negative  Pigeon Serum Abs Latest Ref Range: Negative  Negative     OV 11/18/2017  Chief Complaint  Patient presents with  . Follow-up    Seen by Dr. Jennye Moccasin presents for follow-up.  Working diagnosis is idiopathic pulmonary fibrosis established at [facility].  After I met him I realized that he has significant mold exposure.  His high-resolution CT scan chest done with US showed possible UIP thus lowering the probability that this is in fact UIP.  I sent him to Dr. Roxan Hockey for surgical lung biopsy evaluation.  However in the interim he admitted at [facility] with intracranial hemorrhage according to his history.  He spent a total of 3 weeks between intensive care and inpatient rehab.  He has been discharged with just mild residual defect of mild vision loss in his left eye and a very mild weakness on his left side.  During this time at the hospitalization his Pirfenidone (Esbriet) was held because of "chronic kidney disease".  He says that he was not aware of this diagnosis until after his discharge from the hospital.  His Plavix is on hold is only taking aspirin.  He believes the reason for the intracranial hemorrhage was just hypertension.  At this point in time he is no longer in the house where he has  significant mold exposure.  He had an extensive home inspection done.  He showed me the paperwork for this.  There is definite positive mold exposure in the house.  He is living with his daughter now where there is no mold exposure and he feels because of this his respiratory status is improved.  Occasionally still goes into his house to fetch his computers and at this time he starts coughing and wheezing significantly.  He wants a letter wondering if his pulmonary fibrosis interstitial lung disease is related to the mold exposure.  There are no other new issues.  Review of his chemistry results show that in June 2018 at [facility]: His creatinine is  1.25 mg percent GFR greater than 60.  But in August 2018 his creatinine is 1.4 mg percent with a GFR of 51.  Then at [facility] his creatinine was 1.1 mg percent with a GFR of 72. So was all ok    OV 12/30/2017  Chief Complaint  Patient presents with  . Follow-up    SOB, increased coughing, esbriet still on hold    Follow-up interstitial lung disease with mold exposure [differential diagnosis of IPF versus chronic hypersensitivity pneumonitis] with high-resolution CT chest October showing mixed emphysema and possible UIP 2018 mold and autoimmune panel negative  He is here for follow-up.  At this visit it was supposed to be how he was doing with pirfenidone.  At last visit he expressed concern about renal insufficiency so we checked his creatinine and this was normal.  So we told him to restart his pirfenidone but he did not.  His daughter April who is here with him tells me that they are worried about starting pirfenidone because of GI side effects and his other comorbidities.  They seem more interested in ruling out hypersensitivity pneumonitis but at the same time that do not want surgical lung biopsy.  At this point in time he still off Plavix.  Overall he is stable although 3 weeks ago [place] the [place] and then he had a worsening cough.   Saw primary care physician had 2 rounds of antibiotics and 1 5-day prednisone.  But the cough has not improved.  It is dry and he feels like he has bronchitic symptoms.  He is open to having a bronchoalveolar lavage to look for cell count to differentiate UIP versus hypersensitivity pneumonitis.   Walking desaturation test on 12/30/2017 185 feet x 3 laps on ROOM AIR:  did NOT desaturate. Rest pulse ox was 98%, final pulse ox was 94%. HR response 68/min at rest to 94/min at peak exertion. Patient Othello Dickenson  Did nto Desaturate < 88% . Gavin Potters yes did  Desaturated </= 3% points. Caide Campi es did get tachyardic    OV 01/28/2018  Chief Complaint  Patient presents with  . Follow-up    Pt states he went to his doctor yesterday and was told he had some rattling in both lungs. States breathing is about the same and still has a mild cough but is better with the prednisone.    Follow-up interstitial lung disease with mold exposure [differential diagnosis of IPF versus chronic hypersensitivity pneumonitis] with high-resolution CT chest October showing mixed emphysema and probable UIP 2018 (mild progression Feb 2018 -> oct 2018). Mold and autoimmune panel negative   Violeta Gelinas present for followup for above. Daughter April here with her. Since last visit - Per Dr Rosario Jacks radiologist emial 12/31/17 - [place] FEb 2018 (now uploaded) -> CT Oct 2018 at [facility] : mild progression but hard to tell. And is Probable UIP baed on 2018 ATS criteria. WE set up bronch BAL to ddx UIP v chronic HP but he canceled it. He tells me neurologist advised to hold off. He is only on aspirin (off plavix). SO over phone at his reqiest we started daily low dose prednisone 23m forough  AT this point in time he tells me cough is much much better. Only mild cough. Daughter tells in interim has dx of osteoporosis and L/T spine fracture. Nevertheless they prefer prednisone. He is still interested in bronch BAL but only after neuro  clears him. He has esbriet at  home but holding off till after Bronch BAL. He has new issue of chest tightness that he is prproting when he is lying down but then is chronic issue   OV 03/25/2018  Chief Complaint  Patient presents with  . Follow-up    PFT performed today.  Pt states he has been doing okay and has a complaint of a cough with white thick gluey mucus. Pt also states breathing has become slightly worse.    Follow-up interstitial lung disease with mold exposure [differential diagnosis of IPF versus chronic hypersensitivity pneumonitis] with high-resolution CT chest October showing mixed emphysema and possible Oct 2018 UIP based on 2011 criteria ; mold and autoimmune panel negative   Mr. Whetzel presents with his wife who I am meeting for the first time. At issue is the fact that his interstitial lung disease is possible UIP based on 2011 criteria with a differential diagnosis of chronic hypersensitivity due to moldversus IPF/UIP. He did agree to have a bronchoscopy with lavage to differentiate this but this is been put on hold due to variety of issues regarding his past medical history.This visit is supposed to discuss bronchoscopy again but in the interim he had an acute flare up of his chronic back compression fractures and underwent status post kyphoplasty. His pain is better but he is healing from it. Therefore he wants to put offthe bronchoscopy until at least next visit. Overall he feels stable from a lung health standpoint. He had Pulm. function testing that shows a significant decline in FVC but shows the DLCO to be stable/improved. Subjectively he feels stable. Simple walking desaturation test appears stable though he has a tendency to desaturate. He is not being exposed to the mold and is old home anymore and this helped some. The prednisone he is on is for chronic cough because of the fibrotic lung disease and this is helping her cough significantly. However he still does have some  white mucous.   OV 05/18/2018  Chief Complaint  Patient presents with  . Follow-up    Pt states he is still coughing up brown phlegm and also believes SOB has become worse. Pt denies any complaints of CP/chest tightness.    Follow-up interstitial lung disease with mold exposure [differential diagnosis of IPF versus chronic hypersensitivity pneumonitis] with high-resolution CT chest October showing mixed emphysema and possible Oct 2018 UIP based on 2011 criteria and probable per DR B lietz email based on 2018 ATS criteria. Mmold and autoimmune panel negative   Presents for routine follow-up. In the interim he feels a little bit worse with the shortness of breath and cough. He is not having any mold exposure anymore. He is on prednisone 5 mg per day. He says he has a friend with significant mold exposure and is having significant shortness of breath. He wants the friend to see me. He still has not decided about a bronchoscopy. He gives talk about his back pain. We went over the risks of bronchoscopy. We discussed empiric anti-fibrotic but he is not sure about that either. He says he wants the bronchoscopy first then he does not want to seem to come and go bronchoscopy. He is categorically does not want a surgical lung biopsy.  Results for EARLIE, SCHANK (MRN [id number]) as of 03/25/2018 09:40  Ref. Range 11/18/2017 10:17 03/25/2018 08:50  FVC-Pre Latest Units: L 3.60 3.06  FVC-%Pred-Pre Latest Units: % 76 64  Results for ESTABAN, MAINVILLE (MRN [id number]) as of 03/25/2018 09:40  Ref. Range 11/18/2017  10:17 03/25/2018 08:50  DLCO unc Latest Units: ml/min/mmHg 12.85 14.71  DLCO unc % pred Latest Units: % 39 45    Simple office walk 185 feet x  3 laps goal with forehead probe 03/25/2018  05/18/2018   O2 used Room air Room air  Number laps completed 3 3  Comments about pace normal norml pace  Resting Pulse Ox/HR 95% and 71/min 98% and 76/min  Final Pulse Ox/HR 90% and 98/min 89% and  109/min  Desaturated </= 88% no no  Desaturated <= 3% points yes yes  Got Tachycardic >/= 90/min yes yes  Symptoms at end of test no modertae to severe dyspnea  Miscellaneous comments Half way went down to 88% but with deep breath better  ? worse      has a past medical history of Angina pectoris (HCC), Bilateral low back pain with left-sided sciatica, Bronchitis, chronic (HCC), Chest pain, Chronic bronchitis (HCC), Chronic cough, DDD (degenerative disc disease), lumbar, Elevated troponin, GERD (gastroesophageal reflux disease), Hyperlipidemia, Hypertension, Insomnia, IPF (idiopathic pulmonary fibrosis) ([place]), Lumbar facet arthropathy, Neuropathic pain, Non morbid obesity due to excess calories, Poor tolerance for activity, Sleep apnea, and Stroke ([place]).   reports that he quit smoking about 16 years ago. His smoking use included cigarettes. He has a 54.00 pack-year smoking history. He has never used smokeless tobacco.  Past Surgical History:  Procedure Laterality Date  . triple bypass surgery      Allergies  Allergen Reactions  . Penicillins Shortness Of Breath, Swelling and Other (See Comments)    Has patient had a PCN reaction causing immediate rash, facial/tongue/throat swelling, SOB or lightheadedness with hypotension: No Has patient had a PCN reaction causing severe rash involving mucus membranes or skin necrosis: No Has patient had a PCN reaction that required hospitalization: Yes Has patient had a PCN reaction occurring within the last 10 years: No If all of the above answers are "NO", then may proceed with Cephalosporin use.   . Benadryl [Diphenhydramine] Hives    Immunization History  Administered Date(s) Administered  . Pneumococcal Conjugate-13 10/04/2013  . Pneumococcal Polysaccharide-23 10/04/2013, 01/31/2016    Family History  Problem Relation Age of Onset  . Alzheimer's disease Mother   . Leukemia Father   . Lung cancer Sister      Current Outpatient  Medications:  .  albuterol (PROVENTIL HFA;VENTOLIN HFA) 108 (90 Base) MCG/ACT inhaler, Inhale 2 puffs into the lungs 2 (two) times daily. , Disp: , Rfl:  .  aspirin EC 325 MG tablet, Take 325 mg by mouth daily. , Disp: , Rfl:  .  benzonatate (TESSALON) 100 MG capsule, Take 100 mg by mouth every 8 hours as needed for cough, Disp: , Rfl: 0 .  budesonide-formoterol (SYMBICORT) 80-4.5 MCG/ACT inhaler, Inhale 2 puffs into the lungs 2 (two) times daily. , Disp: , Rfl:  .  cyclobenzaprine (FLEXERIL) 10 MG tablet, Take 10 mg by mouth 3 (three) times daily as needed for muscle spasms. , Disp: , Rfl:  .  docusate sodium (COLACE) 100 MG capsule, Take 100 mg by mouth 2 (two) times daily., Disp: , Rfl: 5 .  esomeprazole (NEXIUM) 40 MG capsule, Take 40 mg by mouth., Disp: , Rfl:  .  furosemide (LASIX) 40 MG tablet, Take 40 mg by mouth daily as needed for fluid. , Disp: , Rfl:  .  gabapentin (NEURONTIN) 400 MG capsule, Take 400 mg by mouth 3 (three) times daily. , Disp: , Rfl:  .  isosorbide mononitrate (IMDUR) 30  MG 24 hr tablet, Take 30 mg by mouth daily. , Disp: , Rfl:  .  ketoconazole (NIZORAL) 2 % shampoo, Apply 1 application topically every other day. , Disp: , Rfl:  .  lamoTRIgine (LAMICTAL) 25 MG tablet, Take 25 mg by mouth daily. , Disp: , Rfl:  .  metoprolol succinate (TOPROL-XL) 50 MG 24 hr tablet, Take 50 mg by mouth 2 (two) times daily. , Disp: , Rfl:  .  morphine (MSIR) 30 MG tablet, Take by mouth., Disp: , Rfl:  .  oxyCODONE-acetaminophen (PERCOCET) 10-325 MG tablet, TAKE 1 TABLET BY MOUTH EVERY 12 (TWELVE) HOURS AS NEEDED FOR UP TO 14 DAYS., Disp: , Rfl: 0 .  polyethylene glycol (MIRALAX / GLYCOLAX) packet, Take 17 g by mouth daily as needed for moderate constipation. , Disp: , Rfl:  .  predniSONE (DELTASONE) 5 MG tablet, Take 1 tablet (5 mg total) by mouth daily with breakfast., Disp: 60 tablet, Rfl: 2 .  ranolazine (RANEXA) 500 MG 12 hr tablet, Take 500 mg by mouth 2 (two) times daily. , Disp: ,  Rfl:  .  rosuvastatin (CRESTOR) 20 MG tablet, Take 20 mg by mouth daily. , Disp: , Rfl:  .  methocarbamol (ROBAXIN) 500 MG tablet, Take 500 mg by mouth daily. , Disp: , Rfl:  .  nitroGLYCERIN (NITROSTAT) 0.4 MG SL tablet, Place 0.4 mg under the tongue every 5 (five) minutes as needed for chest pain. , Disp: , Rfl:    Review of Systems     Objective:   Physical Exam  Constitutional: He is oriented to person, place, and time. He appears well-developed and well-nourished. No distress.  HENT:  Head: Normocephalic and atraumatic.  Right Ear: External ear normal.  Left Ear: External ear normal.  Mouth/Throat: Oropharynx is clear and moist. No oropharyngeal exudate.  Long beard  Eyes: Pupils are equal, round, and reactive to light. Conjunctivae and EOM are normal. Right eye exhibits no discharge. Left eye exhibits no discharge. No scleral icterus.  Neck: Normal range of motion. Neck supple. No JVD present. No tracheal deviation present. No thyromegaly present.  Cardiovascular: Normal rate, regular rhythm and intact distal pulses. Exam reveals no gallop and no friction rub.  No murmur heard. Pulmonary/Chest: Effort normal and breath sounds normal. No respiratory distress. He has no wheezes. He has no rales. He exhibits no tenderness.  Scattered crackles especially at the lung base  Abdominal: Soft. Bowel sounds are normal. He exhibits no distension and no mass. There is no tenderness. There is no rebound and no guarding.  Significant abdominal obesity  Musculoskeletal: Normal range of motion. He exhibits no edema or tenderness.  Lymphadenopathy:    He has no cervical adenopathy.  Neurological: He is alert and oriented to person, place, and time. He has normal reflexes. No cranial nerve deficit. Coordination normal.  Skin: Skin is warm and dry. No rash noted. He is not diaphoretic. No erythema. No pallor.  Psychiatric: He has a normal mood and affect. His behavior is normal. Judgment and  thought content normal.  Nursing note and vitals reviewed.  Vitals:   05/18/18 1452  BP: 112/78  Pulse: 76  SpO2: 98%   Estimated body mass index is 31.85 kg/m as calculated from the following:   Height as of 03/25/18: '5\' 10"'  (1.778 m).   Weight as of 03/25/18: 222 lb (100.7 kg).     Assessment:       ICD-10-CM   1. ILD (interstitial lung disease) ([place]) J84.9  2. Mold exposure Z77.120    Concerned he has progressive interstitial lung disease. He no longer has mold exposure. He continues on prednisone 5 mg per day. I still do not have certainty whether this is IPF versus chronic hypersensitivity pneumonitis.he seems resistant to have bronchoscopy. I discussed with him and? Son-in-law/son extensively about the risks limitations and benefits of bronchoscopy with lavage versus surgical lung biopsy He has agreed to do it. Is worried about his back pain but in talking to him it appears to handle sedation quite well recently with back issues. He is also on opioids.    Plan:      Possibly progressive  Plan Schedule bronchoscopy with lavage (no biopsy) 9/10, 9/11 or 06/11/18 - late morning or early afternoon at [facility]   - Risks of pneumothorax, hemothorax, sedation/anesthesia complications such as cardiac or respiratory arrest or hypotension, stroke and bleeding all explained. Benefits of diagnosis but limitations of non-diagnosis also explained. Patient verbalized understanding and wished to proceed.    Conitnue prednisone 75m per day as before  discussin interstitial lung disease and is very conference second Tuesday in September 2019.  Followup 3rd or 4th week of sept 2019 to ILD clinic to discuss lavage results and next course   > 50% of this > 25 min visit spent in face to face counseling or coordination of care - by this undersigned MD - Dr MBrand Males This includes one or more of the following documented above: discussion of test results, diagnostic or treatment  recommendations, prognosis, risks and benefits of management options, instructions, education, compliance or risk-factor reduction  Dr. MBrand Males M.D., FLovel[facility] Surgery CenterC.P Pulmonary and Critical Care Medicine Staff Physician, CMattituckDirector - Interstitial Lung Disease  Program  Pulmonary [place] [address] Pager: [phone] If no answer or between  15:00h - 7:00h: call [phone] Telephone: [phone]

## 2018-05-18 NOTE — Patient Instructions (Addendum)
ICD-10-CM   1. ILD (interstitial lung disease) (HCC) J84.9   2. Mold exposure Z77.120     Possibly progressive  Plan Schedule bronchoscopy with lavage (no biopsy) 9/10, 9/11 or 06/11/18 [facility]   Conitnue prednisone 5mg  per day as before  Followup 3rd or 4th week of sept 2019 to ILD clinic to discuss lavage results and next course

## 2018-05-19 ENCOUNTER — Ambulatory Visit: Payer: Medicare HMO | Admitting: Internal Medicine

## 2018-05-26 ENCOUNTER — Telehealth: Payer: Self-pay | Admitting: Internal Medicine

## 2018-05-26 NOTE — Telephone Encounter (Signed)
Received an email from MR due to having no NP on Wednesday 9/11, need to change pt's scheduled bronch [facility]  Napoleon Formalled Tara with scheduling to see if we could move the bronch to 9/12 and per Delice Bisonara, we could do that.  Called and spoke with pt to let him know we were changing the date from 9/11 at 11am to 9/12 at 11am and pt expressed understanding.  Routing to MR as an FYI that the change has been made.

## 2018-05-26 NOTE — Telephone Encounter (Signed)
Noted thanks °

## 2018-05-27 ENCOUNTER — Telehealth: Payer: Self-pay | Admitting: Internal Medicine

## 2018-05-27 NOTE — Telephone Encounter (Signed)
Called and spoke with patient, he stated that his wife was wanting him to cancel the procedure scheduled on 9.12.19 due to being worried about the anesthesia and other risks. Advised patient that if he wanted us to talk to his wife about the procedure we could. Wife was transferred onto the phone and I answered all questions that I could to my knowledge and under my scope. Patients wife was really worried about the whole procedure and said she did not feel comfortable with him having it due to past strokes and surgeries. Both patient and wife asked if Dr. Marchelle Gearingamaswamy could give them a call to go into further detail about procedure. Advised I would send a message and request for him to do so.   MR please advise on calling patient. Thank you.

## 2018-05-28 NOTE — Telephone Encounter (Signed)
Please tell wife that  A) understand the concerns from this time she expressed and prior time she spoke to me about it in office.  I wil cancel bronch for now B) will discuss at our monthly conference C) and then we can reinitiate bronch conversation if needed D) if above plan not workable for them - I can call and d.w wife but currently ICU rotation very busy to do that.  E) please have emily - add to discussion for ILD conference  Thanks  Dr. Kalman ShanMurali Taraann Olthoff, M.D., [facility] CenterF.C.C.P Pulmonary and Critical Care Medicine Staff Physician, [facility] Center Director - Interstitial Lung Disease  Program  Pulmonary Fibrosis [facility] - [facility] Network at [facility] [place], [place], [id number]  Pager: [phone], If no answer or between  15:00h - 7:00h: call [phone] Telephone: [phone]

## 2018-05-29 NOTE — Telephone Encounter (Signed)
LMTCB to go over MD Ramaswamy;s recommendations.

## 2018-06-02 NOTE — Telephone Encounter (Signed)
lmtcb for both pt and pt's spouse, Elnita Maxwell.

## 2018-06-02 NOTE — Telephone Encounter (Signed)
Have patient currently on schedule for upcoming ILD conference.

## 2018-06-02 NOTE — Telephone Encounter (Signed)
Spoke with patient, made aware of MD Ramaswamy's recommendations. Patient in agreement to cancel bronch for now and discuss at later appointment on 09/25. Will route info to [facility] per MD Ramaswamy to add patient to ILD conference.

## 2018-06-02 NOTE — Telephone Encounter (Signed)
Patient returned phone call; pt contact # [phone]

## 2018-06-09 ENCOUNTER — Ambulatory Visit: Payer: Self-pay | Admitting: Internal Medicine

## 2018-06-10 ENCOUNTER — Encounter (HOSPITAL_COMMUNITY): Payer: Medicare HMO

## 2018-06-11 ENCOUNTER — Ambulatory Visit (HOSPITAL_COMMUNITY): Admit: 2018-06-11 | Payer: Medicare HMO | Admitting: Internal Medicine

## 2018-06-11 ENCOUNTER — Encounter (HOSPITAL_COMMUNITY): Payer: Self-pay

## 2018-06-11 ENCOUNTER — Encounter (HOSPITAL_COMMUNITY): Payer: Medicare HMO

## 2018-06-11 SURGERY — VIDEO BRONCHOSCOPY WITHOUT FLUORO
Anesthesia: Moderate Sedation | Laterality: Bilateral

## 2018-06-12 NOTE — Progress Notes (Signed)
   Interstitial Lung Disease Multidisciplinary Conference   Chase HamsBarry William Kim    MRN [id number]    DOB 06-24-1956  Primary Care Physician:Lomax, Amy, NP  Referring Physician: Dr Kalman ShanMurali Staphanie Harbison  Date of conference: 06/12/2018  Participating Pulmonary: yes-  Dr. 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 No cranio caudal gradient -  Zonal predominance x  - subpleural findings x - Traction Bronchiectasis x - Reticulation no  - Honeycombing Yes mild - Air trapping x - Ground Glass Opacities Alternate Dx - Consider HP - is the final conclusion per 2018 ATS Criteria  Pathology discussion of biopsy no biops:  PFTs: not discussed  Labs: no*  MDD Impression/Recs: Cosideration is this is not UIP but CT is suggestive of alternate dx of HP. REcommendation is for another HRCT in due course + BAL/Bx to help support dx    SIGNATURE    Dr. Kalman ShanMurali Vidur Knust, M.D., F.C.C.P,  Pulmonary and Critical Care Medicine Staff Physician, [facility] Center Director - Interstitial Lung Disease  Program  Pulmonary Fibrosis [facility] - [facility] at [facility] [place], [place], [id number]  Pager: [phone], If no answer or between  15:00h - 7:00h: call [phone] Telephone: [phone]  5:48 PM 06/12/2018     References: Diagnosis of Idiopathic Pulmonary Fibrosis. An Official ATS/ERS/JRS/ALAT Clinical Practice Guideline. Raghu G et al, Am J Respir Crit Care Med. 2018 Sep 1;198(5):e44-e68.   IPF Suspected   Histopath ology Pattern      UIP  Probable UIP  Indeterminate for  UIP  Alternative  diagnosis    UIP  IPF  IPF  IPF  Non-IPF dx   HRCT   Probabe UIP  IPF  IPF  IPF (Likely)**    Non-IPF dx  Pattern  Indeterminate for UIP  IPF  IPF (Likely)**  Indeterminate  for IPF**  Non-IPF dx    Alternative diagnosis  IPF (Likely)**/ non-IPF dx  Non-IPF dx  Non-IPF dx  Non-IPF dx     Idiopathic pulmonary fibrosis diagnosis based upon HRCT and Biopsy paterns.  ** IPF is the likely diagnosis when any of following features are present:  . Moderate-to-severe traction bronchiectasis/bronchiolectasis (defined as mild traction bronchiectasis/bronchiolectasis in four or more lobes including the lingual as a lobe, or moderate to severe traction bronchiectasis in two or more lobes) in a man over age 62 years or in a woman over age 62 years . Extensive (>30%) reticulation on HRCT and an age >70 years  . Increased neutrophils and/or absence of lymphocytosis in BAL fluid  . Multidisciplinary discussion reaches a confident diagnosis of IPF.   **Indeterminate for IPF  . Without an adequate biopsy is unlikely to be IPF  . With an adequate biopsy may be reclassified to a more specific diagnosis after multidisciplinary discussion and/or additional consultation.   dx = diagnosis; HRCT = high-resolution computed tomography; IPF = idiopathic pulmonary fibrosis; UIP = usual interstitial pneumonia.

## 2018-06-24 ENCOUNTER — Encounter: Payer: Self-pay | Admitting: Internal Medicine

## 2018-06-24 ENCOUNTER — Ambulatory Visit: Payer: Medicare HMO | Admitting: Internal Medicine

## 2018-06-24 VITALS — BP 110/78 | HR 74 | Ht 70.0 in | Wt 214.0 lb

## 2018-06-24 DIAGNOSIS — Z77120 Contact with and (suspected) exposure to mold (toxic): Secondary | ICD-10-CM

## 2018-06-24 DIAGNOSIS — J849 Interstitial pulmonary disease, unspecified: Secondary | ICD-10-CM | POA: Diagnosis not present

## 2018-06-24 NOTE — Patient Instructions (Signed)
ICD-10-CM   1. ILD (interstitial lung disease) (HCC) J84.9   2. Mold exposure Z77.120    -Based on multidisciplinary case conference held in September 2019 we are leaning more towards chronic hypersensitivity pneumonitis as the cause of interstitial lung disease.  This disease is caused by the mold exposure that you have suffered.  Idiopathic pulmonary fibrosis [IPF] is still in the differential but slightly less  PLAN -Bronchoscopy with lavage would be nice to help differentiate this to but I understand and respect her constraints and fears about this given your back issues and therefore we will hold off.  Instead we will do serial monitoring  -Do spirometry and DLCO lung function test in 3 months -Do high-resolution CT chest in 3 months supine and prone   -Continue prednisone 5 mg/day for your cough and potential hypersensitivity pneumonitis  -Defer flu shot  Follow-up -3 months interstitial lung disease clinic Tuesday afternoon or Thursday morning -[address] [phone]

## 2018-06-24 NOTE — Progress Notes (Signed)
PCP Charlott Rakes, MD  HPI   IOV 07/22/2017  Chief Complaint  Patient presents with  . Advice Only    Referred by Marzetta Board from [place] for interstitial pulmonary fibrosis.  C/o prod. cough with white to dark brown with bloody mucus, SOB on exertion, and CP. 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 He tells me that he said insidious onset of cough for the last few years particularly worse in the last 1 year. Approximately 1 year ago he ended up getting a second vaccine of Prevnar 23 at [facility]. Apparently after this the cough started getting worse since then it is been progressive also associated with progressive shortness of breath. The cough was getting worse despitestopping ace inhibitors. Then in February2018 he had the CT scan of the chest.  cT [facility] of the chest without contrast [facility]: First of all it is unclear to me if this is a high-resolution CT scan of the chest. The report does not talk about UIP or possible UIP.the CT scan talks about "subpleural reticular opacities and scattered mild subpleural cystic changes throughout both lungs favored to represent chronic fibrosis. In additionthis reticular linear opacities involving the anterior portions of the lower lobes and lingula  And lateral left upper lobe favored to represent chronic scarring, atelectasis or fibrosis." ,. Review of the [facility] notes indicate that on 11/29/2016 Dr Kathi Ludwig did notice the presence of emphysema on the above changes and also the absence of honeycombing and use the CT scan to diagnose IPF. HIs noted July 2018 specifically states that he felt patient had bilateral  subpleural reticulation without groundglass opacitiesdistributed in all the lobes. He also thought there was some subpleural honeycomb changing. He felt  the CT scan was classic for UIP.By this time patient had seen rheumatologist as I confirmed that on the [facility] notes A diagnosis of IPF was established. Patient was started on Pirfenidone (Esbriet). He completed 8 weeks of this at this point and he is tolerating it fine. LAst LFT Sep 2018   His pulmonary function test on 06/18/2017 shows FVC 3.77 L/79.6% predicted, total lung capacity of 5.34 L/76.3% predicted and DLCO 13.15/38.4% predicted[uncorrected DLCO for hemoglobin]  Walking desaturation test 185 feet 3 laps on room air today: Resting pulse ox 99%. Final pulse ox 94%. Resting heart rate 81/m. Final heart rate 108/m   Autoimmune lab work shows 03/03/2017 CCP less than 5but his rheumatoid factor on the same day was elevated at 80  Other lab work shows an elevation in creatinine on 05/31/2007 1.4 mg percent but is in June it was 1.25 mg percent. Liver function test normal as of September 2018 He is also little bit anemic with 05/30/2017 hemoglobin 12.8 g percent which is slightly down compared to 13.4 g percent in May 2017.nuclear medicine cardiac stress test shows ejection fraction 52% with normal perfusion [facility] May    He is here with TRavis son in law both of them extremely worried about the cough and life expectancy. Asking about research trials.in particular they've done some research and stem cell trials. We discussed this extensively.   Also gives a history of sleep apnea diagnosed several years ago but insurance did not cover his CPAP she is not using it. He  tells me that he does have nocturnal desaturations as tested by his wife at home  [employer] of chest physicians interstitial lung disease questionnaire :: filled and returned the following day and entered by me on 08/01/2017  - Symptoms: cough - moste days +, severe , night +, mucus +, hemotpysis +, x 1.5 years. Dyspnea - clas 3 - Pmhx - heard dz, stroke,   - ROS: GERD +, dry skin +, arthralgia  + - Personal exposure: did used street drugs. Smoked cig age [age], 2ppd - Fam hx: COPD +, rest negative - Home - > 33 years old, There is water damage, mold, animals - bids - Occupation:  Worked for [employer],. X 16.5 years. Dust +, Insulation +, Smoke +, Crawlspace_. Also did work in [employer], Environmental education officer and Oncologist, Marine scientist, asbestos, potery, dtergent, Psychologist, forensic, brakes, malt, - Pneumptox: negative     Acute visit 08/06/17 62 year old with history of IPF, previously followed at [facility] and on [place]. Seen in consultation at [facility] on 10/23 by Dr. Chase Caller He has complaints of increasing cough with dyspnea, fever for the past 1 week.  He developed diarrhea and nausea a few days ago.  Pets: Has dogs, no birds, farm animals, exotic pets Occupation: Works for [employer] with exposure to dust, installation, smoke, possible asbestos. Exposures: Has significant issues with mold from a roof leak for the past 2 years. No jacuzzi, hot tubs. Smoking history: 55-pack-year.  Quit in 2003 Travel History: Not significant   OV 08/27/2017  Chief Complaint  Patient presents with  . Follow-up    Pt is currently on Esbriet. Still has complaints of a cough with mild SOB with exertion.     FU ILD - clinical dx of IPF at [facility]. Now under care at [facility] [place]. Workup in Progress    Presents with daughter April, Anne and her husband Darnelle Maffucci. Wife not present.  Here to review ILD workup for diagnosis and management. They have lot of questions. Etiologic workup so far: Hx revealed significant mold exposure at  Home x 2 year (they are tryhing to get rid of it). He is also reporting recurrent respiratory exacerbation needing prednisone. Daughter thinks atleast 4 times this  Year. Autoimmune panel (below) negative except RF. VAsculitis panel negative. HP panel negative HRCT read by Dr Rosario Jacks and I d/;w her via email again today and personally saw CT and agree with  findings: Old ATS possible UIP. New ATS: indeterminate v probable -> due to lack of honeycombing and nothing beyond mild traction bronchieclectasis. There is associaed emphysema though  Severity: he seems more hampered by his obesity and pain issues. ONO - 10/27 desatured for 160 min. Last visit did not desaturate with walking  Co morbidity:  He does have gerd under control. Has CAD on plavix but 2017 stress test was normal. He has had stroke before and has mild carotid plaque per hx  Therapy: currently in esbriet since sept 2017 and tolerating well. Does have nocturia but probasbly associated with increased water and coffee in day time   IMPRESSION: HRCT Lungs/Pleura: Paraseptal emphysema. Somewhat basilar predominant pattern of subpleural reticulation, ground-glass and mild traction bronchiolectasis. No definite honeycombing. No air trapping. No pleural fluid. Airway is unremarkable. 1. Pulmonary parenchymal pattern of fibrosis may be due to fibrotic nonspecific interstitial pneumonitis or usual interstitial pneumonitis (ATS criteria = possible UIP). 2.  Aortic atherosclerosis (ICD10-170.0). 3.  Emphysema (ICD10-J43.9). 4. Cholelithiasis.   Electronically Signed  By: Lorin Picket M.D.   On: 07/28/2017 15:24   Results for KILAN, BANFILL (MRN [id number]) as of 08/27/2017 12:18  Ref. Range 07/23/2017 09:05  Anit Nuclear Antibody(ANA) Latest Ref Range: NEGATIVE  NEGATIVE  ANCA SCREEN Latest Ref Range: Negative  Negative  Angiotensin-Converting Enzyme Latest Ref Range: 9 - 67 U/L 38  Cyclic Citrullin Peptide Ab Latest Units: UNITS <16  ds DNA Ab Latest Units: IU/mL 2  Myeloperoxidase Abs Latest Units: AI <1.0  Serine Protease 3 Latest Units: AI <1.0  RA Latex Turbid. Latest Ref Range: <14 IU/mL 119 (H)  SSA (Ro) (ENA) Antibody, IgG Latest Ref Range: <1.0 NEG AI <1.0 NEG  SSB (La) (ENA) Antibody, IgG Latest Ref Range: <1.0 NEG AI <1.0 NEG  Scleroderma (Scl-70) (ENA) Antibody,  IgG Latest Ref Range: <1.0 NEG AI <1.0 NEG  Results for BROC, CASPERS (MRN [id number]) as of 08/27/2017 12:18  Ref. Range 08/13/2017 11:21  Candida albicans Latest Units: kU/L <0.10  IgE (Immunoglobulin E), Serum Latest Ref Range: <OR=114 kU/L 13  A.Fumigatus #1 Abs Latest Ref Range: Negative  Negative  Micropolyspora faeni, IgG Latest Ref Range: Negative  Negative  Thermoactinomyces vulgaris, IgG Latest Ref Range: Negative  Negative  A. Pullulans Abs Latest Ref Range: Negative  Negative  Thermoact. Saccharii Latest Ref Range: Negative  Negative  Pigeon Serum Abs Latest Ref Range: Negative  Negative     OV 11/18/2017  Chief Complaint  Patient presents with  . Follow-up    Seen by Dr. Jennye Moccasin presents for follow-up.  Working diagnosis is idiopathic pulmonary fibrosis established at [facility].  After I met him I realized that he has significant mold exposure.  His high-resolution CT scan chest done with US showed possible UIP thus lowering the probability that this is in fact UIP.  I sent him to Dr. Roxan Hockey for surgical lung biopsy evaluation.  However in the interim he admitted at [facility] with intracranial hemorrhage according to his history.  He spent a total of 3 weeks between intensive care and inpatient rehab.  He has been discharged with just mild residual defect of mild vision loss in his left eye and a very mild weakness on his left side.  During this time at the hospitalization his Pirfenidone (Esbriet) was held because of "chronic kidney disease".  He says that he was not aware of this diagnosis until after his discharge from the hospital.  His Plavix is on hold is only taking aspirin.  He believes the reason for the intracranial hemorrhage was just hypertension.  At this point in time he is no longer in the house where he has significant mold exposure.  He had an extensive home inspection done.  He showed me the paperwork for this.   There is definite positive mold exposure in the house.  He is living with his daughter now where there is no mold exposure and he feels because of this his respiratory status is improved.  Occasionally still goes into his house to fetch his computers and at this time he starts coughing and wheezing significantly.  He wants a letter wondering if his pulmonary fibrosis interstitial lung disease is related to the mold exposure.  There are no other new issues.  Review of his chemistry results show that in June 2018 at [facility]: His creatinine is 1.25 mg percent GFR greater than 60.  But in August 2018 his creatinine is 1.4 mg percent with a GFR of 51.  Then at [facility] his creatinine was 1.1 mg percent with a GFR of 72. So was all ok    OV 12/30/2017  Chief Complaint  Patient presents with  . Follow-up    SOB, increased coughing, esbriet still on hold    Follow-up interstitial lung disease with mold exposure [differential diagnosis of IPF versus chronic hypersensitivity pneumonitis] with high-resolution CT chest October showing mixed emphysema and possible UIP 2018 mold and autoimmune panel negative  He is here for follow-up.  At this visit it was supposed to be how he was doing with pirfenidone.  At last visit he expressed concern about renal insufficiency so we checked his creatinine and this was normal.  So we told him to restart his pirfenidone but he did not.  His daughter April who is here with him tells me that they are worried about starting pirfenidone because of GI side effects and his other comorbidities.  They seem more interested in ruling out hypersensitivity pneumonitis but at the same time that do not want surgical lung biopsy.  At this point in time he still off Plavix.  Overall he is stable although 3 weeks [place] the [place] and then he had a worsening cough.  Saw primary care physician had 2 rounds of antibiotics and 1 5-day prednisone.  But the cough has not  improved.  It is dry and he feels like he has bronchitic symptoms.  He is open to having a bronchoalveolar lavage to look for cell count to differentiate UIP versus hypersensitivity pneumonitis.   Walking desaturation test on 12/30/2017 185 feet x 3 laps on ROOM AIR:  did NOT desaturate. Rest pulse ox was 98%, final pulse ox was 94%. HR response 68/min at rest to 94/min at peak exertion. Patient Hao Manfre  Did nto Desaturate < 88% . Avram Rarick yes did  Desaturated </= 3% points. Nemiah Janssens es did get tachyardic    OV 01/28/2018  Chief Complaint  Patient presents with  . Follow-up    Pt states he went to his doctor yesterday and was told he had some rattling in both lungs. States breathing is about the same and still has a mild cough but is better with the prednisone.    Follow-up interstitial lung disease with mold exposure [differential diagnosis of IPF versus chronic hypersensitivity pneumonitis] with high-resolution CT chest October showing mixed emphysema and probable UIP 2018 (mild progression Feb 2018 -> oct 2018). Mold and autoimmune panel negative   Kavaughn William Manganaro present for followup for above. Daughter April here with her. Since last visit - Per Dr Blietz radiologist emial 12/31/17 - [facility] FEb 2018 (now uploaded) -> CT Oct 2018 at [facility] : mild progression but hard to tell. And is Probable UIP baed on 2018 ATS criteria. WE set up bronch BAL to ddx UIP v chronic HP but he canceled it. He tells me neurologist advised to hold off. He is only on aspirin (off plavix). SO over phone at his reqiest we started daily low dose prednisone 5mg forough  AT this point in time he tells me cough is much much better. Only mild cough. Daughter tells in interim has dx of osteoporosis and L/T spine fracture. Nevertheless they prefer prednisone. He is still interested in bronch BAL but only after neuro clears him. He has esbriet at home but holding off till after Bronch BAL. He has new issue of chest  tightness that he is prproting when he is lying down   but then is chronic issue   OV 03/25/2018  Chief Complaint  Patient presents with  . Follow-up    PFT performed today.  Pt states he has been doing okay and has a complaint of a cough with white thick gluey mucus. Pt also states breathing has become slightly worse.    Follow-up interstitial lung disease with mold exposure [differential diagnosis of IPF versus chronic hypersensitivity pneumonitis] with high-resolution CT chest October showing mixed emphysema and possible Oct 2018 UIP based on 2011 criteria ; mold and autoimmune panel negative   Mr. Hoctor presents with his wife who I am meeting for the first time. At issue is the fact that his interstitial lung disease is possible UIP based on 2011 criteria with a differential diagnosis of chronic hypersensitivity due to moldversus IPF/UIP. He did agree to have a bronchoscopy with lavage to differentiate this but this is been put on hold due to variety of issues regarding his past medical history.This visit is supposed to discuss bronchoscopy again but in the interim he had an acute flare up of his chronic back compression fractures and underwent status post kyphoplasty. His pain is better but he is healing from it. Therefore he wants to put offthe bronchoscopy until at least next visit. Overall he feels stable from a lung health standpoint. He had Pulm. function testing that shows a significant decline in FVC but shows the DLCO to be stable/improved. Subjectively he feels stable. Simple walking desaturation test appears stable though he has a tendency to desaturate. He is not being exposed to the mold and is old home anymore and this helped some. The prednisone he is on is for chronic cough because of the fibrotic lung disease and this is helping her cough significantly. However he still does have some white mucous.   OV 05/18/2018  Chief Complaint  Patient presents with  . Follow-up    Pt  states he is still coughing up brown phlegm and also believes SOB has become worse. Pt denies any complaints of CP/chest tightness.    Follow-up interstitial lung disease with mold exposure [differential diagnosis of IPF versus chronic hypersensitivity pneumonitis] with high-resolution CT chest October showing mixed emphysema and possible Oct 2018 UIP based on 2011 criteria and probable per DR B lietz email based on 2018 ATS criteria. Mmold and autoimmune panel negative   Presents for routine follow-up. In the interim he feels a little bit worse with the shortness of breath and cough. He is not having any mold exposure anymore. He is on prednisone 5 mg per day. He says he has a friend with significant mold exposure and is having significant shortness of breath. He wants the friend to see me. He still has not decided about a bronchoscopy. He gives talk about his back pain. We went over the risks of bronchoscopy. We discussed empiric anti-fibrotic but he is not sure about that either. He says he wants the bronchoscopy first then he does not want to seem to come and go bronchoscopy. He is categorically does not want a surgical lung biopsy.        OV 06/24/2018  Subjective:  Patient ID: Chase Kim, male , DOB: 11/22/1955 , age 62 y.o. , MRN: [id number] , ADDRESS: [address] [place] [address]   06/24/2018 -   Chief Complaint  Patient presents with  . Follow-up    no bronch done, wants to wait until he recieves his injections for osteoporosis,        HPI Neale Marzette 62 y.o. - Follow-up interstitial lung disease with mold exposure [differential diagnosis of IPF versus chronic hypersensitivity pneumonitis] with high-resolution CT chest October showing mixed emphysema and possible Oct 2018 UIP based on 2011 criteria and probable per DR B lietz email based on 2018 ATS criteria. Mmold and autoimmune panel negative . And per Dr Weber Cooks - consider alternate dx HP - MDD  discssion sept 2019   Returns for follow-up.  This visit was supposed to evaluate bronchoscopy with lavage results but he refused to have bronchoscopy because of concerns of his back issues and how he would handle sedation even though I deemed him to be low risk.  In the interim we discussed his case at the interstitial lung disease multidisciplinary case conference.  Dr. Weber Cooks the radiologist felt there was diffuse findings  associated with air trapping.  Therefore he thought the patient had alternative radiologic diagnosis as per 2018 ATS criteria.  He was leaning more towards hypersensitivity pneumonitis in the discussion.  Patient tells me that prednisone 5 mg/day helps his cough.  In fact he is feeling better now with the inhalers.  His walking desaturation test is stable/slightly better.     Simple office walk 185 feet x  3 laps goal with forehead probe 03/25/2018  05/18/2018  06/24/2018   O2 used Room air Room air Room air  Number laps completed '3 3 3  '$ Comments about pace normal norml pace normal  Resting Pulse Ox/HR 95% and 71/min 98% and 76/min 99% and 60/min  Final Pulse Ox/HR 90% and 98/min 89% and 109/min 93% and 96/min  Desaturated </= 88% no no no  Desaturated <= 3% points yes yes yes  Got Tachycardic >/= 90/min yes yes yes  Symptoms at end of test no modertae to severe dyspnea Mild dyspnea  Miscellaneous comments Half way went down to 88% but with deep breath better  ? worse     Results for MUSTAPHA, COLSON (MRN [id number]) as of 03/25/2018 09:40  Ref. Range 11/18/2017 10:17 03/25/2018 08:50  FVC-Pre Latest Units: L 3.60 3.06  FVC-%Pred-Pre Latest Units: % 76 64  Results for SENAY, SISTRUNK (MRN [id number]) as of 03/25/2018 09:40  Ref. Range 11/18/2017 10:17 03/25/2018 08:50  DLCO unc Latest Units: ml/min/mmHg 12.85 14.71  DLCO unc % pred Latest Units: % 39 45   ROS - per HPI     has a past medical history of Angina pectoris ([place]), Bilateral low back pain with  left-sided sciatica, Bronchitis, chronic (HCC), Chest pain, Chronic bronchitis (HCC), Chronic cough, DDD (degenerative disc disease), lumbar, Elevated troponin, GERD (gastroesophageal reflux disease), Hyperlipidemia, Hypertension, Insomnia, IPF (idiopathic pulmonary fibrosis) ([place]), Lumbar facet arthropathy, Neuropathic pain, Non morbid obesity due to excess calories, Poor tolerance for activity, Sleep apnea, and Stroke ([place]).   reports that he quit smoking about 16 years ago. His smoking use included cigarettes. He has a 54.00 pack-year smoking history. He has never used smokeless tobacco.  Past Surgical History:  Procedure Laterality Date  . triple bypass surgery      Allergies  Allergen Reactions  . Penicillins Shortness Of Breath, Swelling and Other (See Comments)    Has patient had a PCN reaction causing immediate rash, facial/tongue/throat swelling, SOB or lightheadedness with hypotension: No Has patient had a PCN reaction causing severe rash involving mucus membranes or skin necrosis: No Has patient had a PCN reaction that required hospitalization: Yes Has patient had a PCN reaction occurring within the last 10 years: No  If all of the above answers are "NO", then may proceed with Cephalosporin use.   . Benadryl [Diphenhydramine] Hives    Immunization History  Administered Date(s) Administered  . Pneumococcal Conjugate-13 10/04/2013  . Pneumococcal Polysaccharide-23 10/04/2013, 01/31/2016    Family History  Problem Relation Age of Onset  . Alzheimer's disease Mother   . Leukemia Father   . Lung cancer Sister      Current Outpatient Medications:  .  albuterol (PROVENTIL HFA;VENTOLIN HFA) 108 (90 Base) MCG/ACT inhaler, Inhale 2 puffs into the lungs 2 (two) times daily. , Disp: , Rfl:  .  aspirin EC 325 MG tablet, Take 325 mg by mouth daily. , Disp: , Rfl:  .  benzonatate (TESSALON) 100 MG capsule, Take 100 mg by mouth every 8 hours as needed for cough, Disp: , Rfl: 0 .   budesonide-formoterol (SYMBICORT) 80-4.5 MCG/ACT inhaler, Inhale 2 puffs into the lungs 2 (two) times daily. , Disp: , Rfl:  .  cyclobenzaprine (FLEXERIL) 10 MG tablet, Take 10 mg by mouth 3 (three) times daily as needed for muscle spasms. , Disp: , Rfl:  .  docusate sodium (COLACE) 100 MG capsule, Take 100 mg by mouth 2 (two) times daily., Disp: , Rfl: 5 .  esomeprazole (NEXIUM) 40 MG capsule, Take 40 mg by mouth., Disp: , Rfl:  .  furosemide (LASIX) 40 MG tablet, Take 40 mg by mouth daily as needed for fluid. , Disp: , Rfl:  .  gabapentin (NEURONTIN) 400 MG capsule, Take 400 mg by mouth 3 (three) times daily. , Disp: , Rfl:  .  isosorbide mononitrate (IMDUR) 30 MG 24 hr tablet, Take 30 mg by mouth daily. , Disp: , Rfl:  .  ketoconazole (NIZORAL) 2 % shampoo, Apply 1 application topically every other day. , Disp: , Rfl:  .  lamoTRIgine (LAMICTAL) 25 MG tablet, Take 25 mg by mouth daily. , Disp: , Rfl:  .  methocarbamol (ROBAXIN) 500 MG tablet, Take 500 mg by mouth daily. , Disp: , Rfl:  .  metoprolol succinate (TOPROL-XL) 50 MG 24 hr tablet, Take 50 mg by mouth 2 (two) times daily. , Disp: , Rfl:  .  morphine (MSIR) 30 MG tablet, Take by mouth., Disp: , Rfl:  .  nitroGLYCERIN (NITROSTAT) 0.4 MG SL tablet, Place 0.4 mg under the tongue every 5 (five) minutes as needed for chest pain. , Disp: , Rfl:  .  oxyCODONE-acetaminophen (PERCOCET) 10-325 MG tablet, TAKE 1 TABLET BY MOUTH EVERY 12 (TWELVE) HOURS AS NEEDED FOR UP TO 14 DAYS., Disp: , Rfl: 0 .  polyethylene glycol (MIRALAX / GLYCOLAX) packet, Take 17 g by mouth daily as needed for moderate constipation. , Disp: , Rfl:  .  predniSONE (DELTASONE) 5 MG tablet, Take 1 tablet (5 mg total) by mouth daily with breakfast., Disp: 60 tablet, Rfl: 2 .  ranolazine (RANEXA) 500 MG 12 hr tablet, Take 500 mg by mouth 2 (two) times daily. , Disp: , Rfl:  .  rosuvastatin (CRESTOR) 20 MG tablet, Take 20 mg by mouth daily. , Disp: , Rfl:       Objective:    Vitals:   06/24/18 1145  BP: 110/78  Pulse: 74  SpO2: 93%  Weight: 214 lb (97.1 kg)  Height: '5\' 10"'$  (1.778 m)    Estimated body mass index is 30.71 kg/m as calculated from the following:   Height as of this encounter: '5\' 10"'$  (1.778 m).   Weight as of this encounter: 214 lb (97.1  kg).  '@WEIGHTCHANGE'$ @  Filed Weights   06/24/18 1145  Weight: 214 lb (97.1 kg)     Physical Exam  General Appearance:    Alert, cooperative, no distress, appears stated age - no, looks older , sitting on - chair , Deconditioned looking - mild  Head:    Normocephalic, without obvious abnormality, atraumatic  Eyes:    PERRL, conjunctiva/corneas clear,  Ears:    Normal TM's and external ear canals, both ears  Nose:   Nares normal, septum midline, mucosa normal, no drainage    or sinus tenderness. OXYGEN ON  - no . Patient is @ ra   Throat:   Lips, mucosa, and tongue normal; teeth and gums normal. Cyanosis on lips - no  Neck:   Supple, symmetrical, trachea midline, no adenopathy;    thyroid:  no enlargement/tenderness/nodules; no carotid   bruit or JVD  Back:     Symmetric, no curvature, ROM normal, no CVA tenderness  Lungs:     Distress - no , Wheeze no, Barrell Chest - no, Purse lip breathing - no, Crackles - no   Chest Wall:    No tenderness or deformity. Scars in chest no   Heart:    Regular rate and rhythm, S1 and S2 normal, no rub   or gallop, Murmur - no  Breast Exam:    NOT DONE  Abdomen:     Soft, non-tender, bowel sounds active all four quadrants,    no masses, no organomegaly. Visceral obesity - no  Genitalia:   NOT DONE  Rectal:   NOT DONE  Extremities:   Extremities normal, atraumatic, Clubbing - yes, Edema - no  Pulses:   2+ and symmetric all extremities  Skin:   Stigmata of Connective Tissue Disease - no  Lymph nodes:   Cervical, supraclavicular, and axillary nodes normal  Psychiatric:  Neurologic:    flat affect  CAm-ICU - neg, Alert and Oriented x 3 - yes, Moves all 4s - yes,  Speech - normal, Cognition - intact           Assessment:       ICD-10-CM   1. ILD (interstitial lung disease) ([facility]) J84.9 Pulmonary function test  2. Mold exposure Z77.120   3. Interstitial pulmonary disease ([place]) J84.9 CT Chest High Resolution       Plan:     Patient Instructions     ICD-10-CM   1. ILD (interstitial lung disease) ([place]) J84.9   2. Mold exposure Z77.120    -Based on multidisciplinary case conference held in September 2019 we are leaning more towards chronic hypersensitivity pneumonitis as the cause of interstitial lung disease.  This disease is caused by the mold exposure that you have suffered.  Idiopathic pulmonary fibrosis [IPF] is still in the differential but slightly less  PLAN -Bronchoscopy with lavage would be nice to help differentiate this to but I understand and respect her constraints and fears about this given your back issues and therefore we will hold off.  Instead we will do serial monitoring  -Do spirometry and DLCO lung function test in 3 months -Do high-resolution CT chest in 3 months supine and prone   -Continue prednisone 5 mg/day for your cough and potential hypersensitivity pneumonitis  -Defer flu shot  Follow-up -3 months interstitial lung disease clinic Tuesday afternoon or Thursday morning -[place]., [phone]   > 50% of this > 25 min visit spent in face to face counseling or coordination of care - by  this undersigned MD - Dr Brand Males. This includes one or more of the following documented above: discussion of test results, diagnostic or treatment recommendations, prognosis, risks and benefits of management options, instructions, education, compliance or risk-factor reduction   SIGNATURE    Dr. Brand Males, M.D., F.C.C.P,  Pulmonary and Critical Care Medicine Staff Physician, [place] Director - Interstitial Lung Disease  Program  [facility]  [place], [place], [address]  Pager: [phone], If no answer or between  15:00h - 7:00h: call [phone] Telephone: [phone]  12:18 PM 06/24/2018

## 2018-06-30 ENCOUNTER — Ambulatory Visit: Payer: Medicare HMO | Admitting: Adult Health

## 2018-07-03 ENCOUNTER — Other Ambulatory Visit: Payer: Self-pay | Admitting: Internal Medicine

## 2018-07-17 ENCOUNTER — Other Ambulatory Visit: Payer: Self-pay

## 2018-07-17 MED ORDER — BENZONATATE 100 MG PO CAPS: ORAL_CAPSULE | ORAL | 0 refills | Status: AC

## 2018-07-17 MED ORDER — PREDNISONE 5 MG PO TABS: ORAL_TABLET | ORAL | 2 refills | Status: AC

## 2018-07-17 MED ORDER — ALBUTEROL SULFATE HFA 108 (90 BASE) MCG/ACT IN AERS: 2.0000 | INHALATION_SPRAY | Freq: Two times a day (BID) | RESPIRATORY_TRACT | 2 refills | Status: AC

## 2018-07-17 MED ORDER — BUDESONIDE-FORMOTEROL FUMARATE 80-4.5 MCG/ACT IN AERO: 2.0000 | INHALATION_SPRAY | Freq: Two times a day (BID) | RESPIRATORY_TRACT | 2 refills | Status: AC

## 2018-09-08 ENCOUNTER — Ambulatory Visit: Payer: Medicare HMO | Admitting: Internal Medicine

## 2018-09-08 ENCOUNTER — Encounter: Payer: Self-pay | Admitting: Internal Medicine

## 2018-09-08 VITALS — BP 118/60 | HR 81 | Ht 70.0 in | Wt 209.6 lb

## 2018-09-08 DIAGNOSIS — J209 Acute bronchitis, unspecified: Secondary | ICD-10-CM

## 2018-09-08 DIAGNOSIS — J849 Interstitial pulmonary disease, unspecified: Secondary | ICD-10-CM | POA: Diagnosis not present

## 2018-09-08 DIAGNOSIS — Z77120 Contact with and (suspected) exposure to mold (toxic): Secondary | ICD-10-CM

## 2018-09-08 LAB — PULMONARY FUNCTION TEST
DL/VA % pred: 62 %
DL/VA: 2.88 ml/min/mmHg/L
DLCO UNC: 13.23 ml/min/mmHg
DLCO unc % pred: 40 %
FEF 25-75 PRE: 2.94 L/s
FEF2575-%PRED-PRE: 102 %
FEV1-%PRED-PRE: 73 %
FEV1-Pre: 2.6 L
FEV1FVC-%PRED-PRE: 108 %
FEV6-%PRED-PRE: 70 %
FEV6-PRE: 3.16 L
FEV6FVC-%Pred-Pre: 104 %
FVC-%Pred-Pre: 67 %
FVC-PRE: 3.18 L
PRE FEV1/FVC RATIO: 82 %
PRE FEV6/FVC RATIO: 100 %

## 2018-09-08 MED ORDER — PREDNISONE 10 MG PO TABS: ORAL_TABLET | ORAL | 0 refills | Status: AC

## 2018-09-08 MED ORDER — DOXYCYCLINE HYCLATE 100 MG PO TABS: 100.0000 mg | ORAL_TABLET | Freq: Two times a day (BID) | ORAL | 0 refills | Status: AC

## 2018-09-08 NOTE — Progress Notes (Signed)
pft  

## 2018-09-08 NOTE — Progress Notes (Signed)
PCP Charlott Rakes, MD  HPI   IOV 07/22/2017  Chief Complaint  Patient presents with  . Advice Only    Referred by Marzetta Board from [place] for interstitial pulmonary fibrosis.  C/o prod. cough with white to dark brown with bloody mucus, SOB on exertion, and CP. 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 He tells me that he said insidious onset of cough for the last few years particularly worse in the last 1 year. Approximately 1 year ago he ended up getting a second vaccine of Prevnar 23 at [facility]. Apparently after this the cough started getting worse since then it is been progressive also associated with progressive shortness of breath. The cough was getting worse despitestopping ace inhibitors. Then in February2018 he had the CT scan of the chest.  cT scan of the chest without contrast [facility] at [facility]: First of all it is unclear to me if this is a high-resolution CT scan of the chest. The report does not talk about UIP or possible UIP.the CT scan talks about "subpleural reticular opacities and scattered mild subpleural cystic changes throughout both lungs favored to represent chronic fibrosis. In additionthis reticular linear opacities involving the anterior portions of the lower lobes and lingula  And lateral left upper lobe favored to represent chronic scarring, atelectasis or fibrosis." ,. Review of the [facility] notes indicate that on 11/29/2016 Dr Kathi Ludwig did notice the presence of emphysema on the above changes and also the absence of honeycombing and use the CT scan to diagnose IPF. HIs noted July 2018 specifically states that he felt patient had bilateral  subpleural reticulation without groundglass opacitiesdistributed in all the lobes. He also thought there was some subpleural honeycomb changing. He  felt the CT scan was classic for UIP.By this time patient had seen rheumatologist as I confirmed that on the [facility] notes A diagnosis of IPF was established. Patient was started on Pirfenidone (Esbriet). He completed 8 weeks of this at this point and he is tolerating it fine. LAst LFT Sep 2018   His pulmonary function test on 06/18/2017 shows FVC 3.77 L/79.6% predicted, total lung capacity of 5.34 L/76.3% predicted and DLCO 13.15/38.4% predicted[uncorrected DLCO for hemoglobin]  Walking desaturation test 185 feet 3 laps on room air today: Resting pulse ox 99%. Final pulse ox 94%. Resting heart rate 81/m. Final heart rate 108/m   Autoimmune lab work shows 03/03/2017 CCP less than 5but his rheumatoid factor on the same day was elevated at 80  Other lab work shows an elevation in creatinine on 05/31/2007 1.4 mg percent but is in June it was 1.25 mg percent. Liver function test normal as of September 2018 He is also little bit anemic with 05/30/2017 hemoglobin 12.8 g percent which is slightly down compared to 13.4 g percent in May 2017.nuclear medicine cardiac stress test shows ejection fraction 52% with normal perfusion [facility] May    He is here with TRavis son in law both of them extremely worried about the cough and life expectancy. Asking about research trials.in particular they've done some research and stem cell trials. We discussed this extensively.   Also gives a history of sleep apnea diagnosed several years ago but insurance did not cover his CPAP she is not using it.  He tells me that he does have nocturnal desaturations as tested by his wife at home  [employer] of chest physicians interstitial lung disease questionnaire :: filled and returned the following day and entered by me on 08/01/2017  - Symptoms: cough - moste days +, severe , night +, mucus +, hemotpysis +, x 1.5 years. Dyspnea - clas 3 - Pmhx - heard dz, stroke,   - ROS: GERD +, dry skin +,  arthralgia + - Personal exposure: did used street drugs. Smoked cig age [age], 2ppd - Fam hx: COPD +, rest negative - Home - > 46 years old, There is water damage, mold, animals - bids - Occupation:  Worked for [employer],. X 16.5 years. Dust +, Insulation +, Smoke +, Crawlspace_. Also did work in [employer], Environmental education officer and Oncologist, Marine scientist, asbestos, potery, dtergent, Psychologist, forensic, brakes, malt, - Pneumptox: negative     Acute visit 08/06/17 62 year old with history of IPF, previously followed at [facility] and on [place]. Seen in consultation at [facility] on 10/23 by Dr. Chase Caller He has complaints of increasing cough with dyspnea, fever for the past 1 week.  He developed diarrhea and nausea a few days ago.  Pets: Has dogs, no birds, farm animals, exotic pets Occupation: Works for [employer] with exposure to dust, installation, smoke, possible asbestos. Exposures: Has significant issues with mold from a roof leak for the past 2 years. No jacuzzi, hot tubs. Smoking history: 55-pack-year.  Quit in 2003 Travel History: Not significant   OV 08/27/2017  Chief Complaint  Patient presents with  . Follow-up    Pt is currently on Esbriet. Still has complaints of a cough with mild SOB with exertion.     FU ILD - clinical dx of IPF at [facility]. Now under care at [facility]. Workup in Progress    Presents with daughter April, Anne and her husband Darnelle Maffucci. Wife not present.  Here to review ILD workup for diagnosis and management. They have lot of questions. Etiologic workup so far: Hx revealed significant mold exposure at  Home x 2 year (they are tryhing to get rid of it). He is also reporting recurrent respiratory exacerbation needing prednisone. Daughter thinks atleast 4 times this  Year. Autoimmune panel (below) negative except RF. VAsculitis panel negative. HP panel negative HRCT read by Dr Rosario Jacks and I d/;w her via email again today and personally saw CT and agree  with findings: Old ATS possible UIP. New ATS: indeterminate v probable -> due to lack of honeycombing and nothing beyond mild traction bronchieclectasis. There is associaed emphysema though  Severity: he seems more hampered by his obesity and pain issues. ONO - 10/27 desatured for 160 min. Last visit did not desaturate with walking  Co morbidity:  He does have gerd under control. Has CAD on plavix but 2017 stress test was normal. He has had stroke before and has mild carotid plaque per hx  Therapy: currently in esbriet since sept 2017 and tolerating well. Does have nocturia but probasbly associated with increased water and coffee in day time   IMPRESSION: HRCT Lungs/Pleura: Paraseptal emphysema. Somewhat basilar predominant pattern of subpleural reticulation, ground-glass and mild traction bronchiolectasis. No definite honeycombing. No air trapping. No pleural fluid. Airway is unremarkable. 1. Pulmonary parenchymal pattern of fibrosis may be due to fibrotic nonspecific interstitial pneumonitis or usual interstitial pneumonitis (ATS criteria = possible UIP). 2.  Aortic atherosclerosis (ICD10-170.0). 3.  Emphysema (ICD10-J43.9). 4. Cholelithiasis.   Electronically Signed  By: Lorin Picket M.D.   On: 07/28/2017 15:24   Results for ALFONS, SULKOWSKI (MRN [id number]) as of 08/27/2017 12:18  Ref. Range 07/23/2017 09:05  Anit Nuclear Antibody(ANA) Latest Ref Range: NEGATIVE  NEGATIVE  ANCA SCREEN Latest Ref Range: Negative  Negative  Angiotensin-Converting Enzyme Latest Ref Range: 9 - 67 U/L 38  Cyclic Citrullin Peptide Ab Latest Units: UNITS <16  ds DNA Ab Latest Units: IU/mL 2  Myeloperoxidase Abs Latest Units: AI <1.0  Serine Protease 3 Latest Units: AI <1.0  RA Latex Turbid. Latest Ref Range: <14 IU/mL 119 (H)  SSA (Ro) (ENA) Antibody, IgG Latest Ref Range: <1.0 NEG AI <1.0 NEG  SSB (La) (ENA) Antibody, IgG Latest Ref Range: <1.0 NEG AI <1.0 NEG  Scleroderma (Scl-70) (ENA)  Antibody, IgG Latest Ref Range: <1.0 NEG AI <1.0 NEG  Results for WARD, BOISSONNEAULT (MRN [id number]) as of 08/27/2017 12:18  Ref. Range 08/13/2017 11:21  Candida albicans Latest Units: kU/L <0.10  IgE (Immunoglobulin E), Serum Latest Ref Range: <OR=114 kU/L 13  A.Fumigatus #1 Abs Latest Ref Range: Negative  Negative  Micropolyspora faeni, IgG Latest Ref Range: Negative  Negative  Thermoactinomyces vulgaris, IgG Latest Ref Range: Negative  Negative  A. Pullulans Abs Latest Ref Range: Negative  Negative  Thermoact. Saccharii Latest Ref Range: Negative  Negative  Pigeon Serum Abs Latest Ref Range: Negative  Negative     OV 11/18/2017  Chief Complaint  Patient presents with  . Follow-up    Seen by Dr. Jennye Moccasin presents for follow-up.  Working diagnosis is idiopathic pulmonary fibrosis established at [facility].  After I met him I realized that he has significant mold exposure.  His high-resolution CT scan chest done with US showed possible UIP thus lowering the probability that this is in fact UIP.  I sent him to Dr. Roxan Hockey for surgical lung biopsy evaluation.  However in the interim he admitted at [facility] with intracranial hemorrhage according to his history.  He spent a total of 3 weeks between intensive care and inpatient rehab.  He has been discharged with just mild residual defect of mild vision loss in his left eye and a very mild weakness on his left side.  During this time at the hospitalization his Pirfenidone (Esbriet) was held because of "chronic kidney disease".  He says that he was not aware of this diagnosis until after his discharge from the hospital.  His Plavix is on hold is only taking aspirin.  He believes the reason for the intracranial hemorrhage was just hypertension.  At this point in time he is no longer in the house where he has significant mold exposure.  He had an extensive home inspection done.  He showed me the paperwork for  this.  There is definite positive mold exposure in the house.  He is living with his daughter now where there is no mold exposure and he feels because of this his respiratory status is improved.  Occasionally still goes into his house to fetch his computers and at this time he starts coughing and wheezing significantly.  He wants a letter wondering if his pulmonary fibrosis interstitial lung disease is related to the mold exposure.  There are no other new issues.  Review of his chemistry results show that in June 2018 at [facility]: His creatinine is 1.25 mg percent GFR greater than 60.  But in August 2018 his creatinine is 1.4 mg percent with a GFR of 51.  Then at [facility] his creatinine was 1.1 mg percent with a GFR of 72. So was all ok    OV 12/30/2017  Chief Complaint  Patient presents with  . Follow-up    SOB, increased coughing, esbriet still on hold    Follow-up interstitial lung disease with mold exposure [differential diagnosis of IPF versus chronic hypersensitivity pneumonitis] with high-resolution CT chest October showing mixed emphysema and possible UIP 2018 mold and autoimmune panel negative  He is here for follow-up.  At this visit it was supposed to be how he was doing with pirfenidone.  At last visit he expressed concern about renal insufficiency so we checked his creatinine and this was normal.  So we told him to restart his pirfenidone but he did not.  His daughter April who is here with him tells me that they are worried about starting pirfenidone because of GI side effects and his other comorbidities.  They seem more interested in ruling out hypersensitivity pneumonitis but at the same time that do not want surgical lung biopsy.  At this point in time he still off Plavix.  Overall he is stable although 3 weeks [place] the [place] and then he had a worsening cough.  Saw primary care physician had 2 rounds of antibiotics and 1 5-day prednisone.  But the cough has not  improved.  It is dry and he feels like he has bronchitic symptoms.  He is open to having a bronchoalveolar lavage to look for cell count to differentiate UIP versus hypersensitivity pneumonitis.   Walking desaturation test on 12/30/2017 185 feet x 3 laps on ROOM AIR:  did NOT desaturate. Rest pulse ox was 98%, final pulse ox was 94%. HR response 68/min at rest to 94/min at peak exertion. Patient Chase Kim  Did nto Desaturate < 88% . Chase Kim yes did  Desaturated </= 3% points. Chase Kim did get tachyardic    OV 01/28/2018  Chief Complaint  Patient presents with  . Follow-up    Pt states he went to his doctor yesterday and was told he had some rattling in both lungs. States breathing is about the same and still has a mild cough but is better with the prednisone.    Follow-up interstitial lung disease with mold exposure [differential diagnosis of IPF versus chronic hypersensitivity pneumonitis] with high-resolution CT chest October showing mixed emphysema and probable UIP 2018 (mild progression Feb 2018 -> oct 2018). Mold and autoimmune panel negative   Chase Kim present for followup for above. Daughter April here with her. Since last visit - Per Dr Blietz radiologist emial 12/31/17 - CT [place] FE[place] 2018 (now uploaded) -> CT Oct 2018 at [facility] : mild progression but hard to tell. And is Probable UIP baed on 2018 ATS criteria. WE set up bronch BAL to ddx UIP v chronic HP but he canceled it. He tells me neurologist advised to hold off. He is only on aspirin (off plavix). SO over phone at his reqiest we started daily low dose prednisone 5mg forough  AT this point in time he tells me cough is much much better. Only mild cough. Daughter tells in interim has dx of osteoporosis and L/T spine fracture. Nevertheless they prefer prednisone. He is still interested in bronch BAL but only after neuro clears him. He has esbriet at home but holding off till after Bronch BAL. He has new issue of chest  tightness that he is prproting when he is lying down   but then is chronic issue   OV 03/25/2018  Chief Complaint  Patient presents with  . Follow-up    PFT performed today.  Pt states he has been doing okay and has a complaint of a cough with white thick gluey mucus. Pt also states breathing has become slightly worse.    Follow-up interstitial lung disease with mold exposure [differential diagnosis of IPF versus chronic hypersensitivity pneumonitis] with high-resolution CT chest October showing mixed emphysema and possible Oct 2018 UIP based on 2011 criteria ; mold and autoimmune panel negative   Mr. Mcinroy presents with his wife who I am meeting for the first time. At issue is the fact that his interstitial lung disease is possible UIP based on 2011 criteria with a differential diagnosis of chronic hypersensitivity due to moldversus IPF/UIP. He did agree to have a bronchoscopy with lavage to differentiate this but this is been put on hold due to variety of issues regarding his past medical history.This visit is supposed to discuss bronchoscopy again but in the interim he had an acute flare up of his chronic back compression fractures and underwent status post kyphoplasty. His pain is better but he is healing from it. Therefore he wants to put offthe bronchoscopy until at least next visit. Overall he feels stable from a lung health standpoint. He had Pulm. function testing that shows a significant decline in FVC but shows the DLCO to be stable/improved. Subjectively he feels stable. Simple walking desaturation test appears stable though he has a tendency to desaturate. He is not being exposed to the mold and is old home anymore and this helped some. The prednisone he is on is for chronic cough because of the fibrotic lung disease and this is helping her cough significantly. However he still does have some white mucous.   OV 05/18/2018  Chief Complaint  Patient presents with  . Follow-up    Pt  states he is still coughing up brown phlegm and also believes SOB has become worse. Pt denies any complaints of CP/chest tightness.    Follow-up interstitial lung disease with mold exposure [differential diagnosis of IPF versus chronic hypersensitivity pneumonitis] with high-resolution CT chest October showing mixed emphysema and possible Oct 2018 UIP based on 2011 criteria and probable per DR B lietz email based on 2018 ATS criteria. Mmold and autoimmune panel negative   Presents for routine follow-up. In the interim he feels a little bit worse with the shortness of breath and cough. He is not having any mold exposure anymore. He is on prednisone 5 mg per day. He says he has a friend with significant mold exposure and is having significant shortness of breath. He wants the friend to see me. He still has not decided about a bronchoscopy. He gives talk about his back pain. We went over the risks of bronchoscopy. We discussed empiric anti-fibrotic but he is not sure about that either. He says he wants the bronchoscopy first then he does not want to seem to come and go bronchoscopy. He is categorically does not want a surgical lung biopsy.        OV 06/24/2018  Subjective:  Patient ID: Chase Kim, male , DOB: 04/09/1956 , age 62 y.o. , MRN: [id number] , ADDRESS: [address] [place] [address]   06/24/2018 -   Chief Complaint  Patient presents with  . Follow-up    no bronch done, wants to wait until he recieves his injections for osteoporosis,        HPI Chase Kim 62 y.o. - Follow-up interstitial lung disease with mold exposure [differential diagnosis of IPF versus chronic hypersensitivity pneumonitis] with high-resolution CT chest October showing mixed emphysema and possible Oct 2018 UIP based on 2011 criteria and probable per DR B lietz email based on 2018 ATS criteria. Mmold and autoimmune panel negative . And per Dr Entrikin - consider alternate dx HP - MDD  discssion sept 2019   Returns for follow-up.  This visit was supposed to evaluate bronchoscopy with lavage results but he refused to have bronchoscopy because of concerns of his back issues and how he would handle sedation even though I deemed him to be low risk.  In the interim we discussed his case at the interstitial lung disease multidisciplinary case conference.  Dr. Entrikin the radiologist felt there was diffuse findings  associated with air trapping.  Therefore he thought the patient had alternative radiologic diagnosis as per 2018 ATS criteria.  He was leaning more towards hypersensitivity pneumonitis in the discussion.  Patient tells me that prednisone 5 mg/day helps his cough.  In fact he is feeling better now with the inhalers.  His walking desaturation test is stable/slightly better.       OV 09/08/2018  Subjective:  Patient ID: Chase Kim, male , DOB: 04/20/1956 , age 62 y.o. , MRN: [id number] , ADDRESS: [address] [place] [address]   09/08/2018 -   Chief Complaint  Patient presents with  . Follow-up    3 month f/u. c/o sob with exertion,prod cough with thick white mucus, wheezing & occ chest tightness.      HPI Chase Kim 62 y.o. -presence of ILD follow-up believed to be hypersensitivity pneumonitis clinical picture [he has refused bronchoscopy and biopsy to differentiate between this and IPF] -based on MDD  Overall stable.  He does have thick cough with white sputum.  He says prednisone has helped him but he still has the symptoms.  He also shortness of breath on exertion.  At one point he said he is not significantly worse compared to his baseline but at another point he said he might be worse in the last several days.  He is willing to try antibiotics.  We discussed the results of the new INBUILD study given the progression [possible] and a CT scan of [place] compared to [place] 2018 10/2017.  He says he is getting a lot of  injections for his bones and osteoporosis and he has a lot of fatigue and pain.  Therefore he is somewhat reluctant.  Plus his family also gets very nervous about starting new medications.  In the past he has had stroke which he believes is due to anticoagulation.  He is concerned about the theoretical risk of bleeding with nintedanib.  He wants to take a booklet and read and discussed with his family.  He had walking desaturation test and it appears stable.  He had spirometry and DLCO in the some fluctuation but the overall trend might be stable.  And this is from February 2019.   Simple office walk 185 feet x  3 laps goal with forehead probe 03/25/2018  05/18/2018  06/24/2018  09/08/2018   O2 used Room air Room air Room air Room air  Number laps completed 3 3 3 3, w [address], 250 laps x 3  Comments about pace normal norml pace normal normal  Resting Pulse Ox/HR 95% and 71/min 98% and 76/min 99% and 60/min 97% and 85/min    Final Pulse Ox/HR 90% and 98/min 89% and 109/min 93% and 96/min 93% and 109/min  Desaturated </= 88% no no no no  Desaturated <= 3% points Yes, 5points Yes, 9 ponts Yes, 6 points Yes, 4 points  Got Tachycardic >/= 90/min yes Yes, yes Yes, more  Symptoms at end of test no modertae to severe dyspnea Mild dyspnea dyspnea  Miscellaneous comments Half way went down to 88% but with deep breath better  ? worse       Results for Osmond, Chase Kim (MRN [id number]) as of 09/08/2018 16:04  Ref. Range 11/18/2017 10:30 03/25/2018 08:50 09/08/2018 14:45  FVC-Pre Latest Units: L 3.60 3.06 3.18  FVC-%Pred-Pre Latest Units: % 76 64 67   Results for Chase Kim, Chase Kim (MRN [id number]) as of 09/08/2018 16:04  Ref. Range 11/18/2017 10:30 03/25/2018 08:50 09/08/2018 14:45  DLCO unc Latest Units: ml/min/mmHg 12.85 14.71 13.23  DLCO unc % pred Latest Units: % 39 45 40     ROS - per HPI     has a past medical history of Angina pectoris (HCC), Bilateral low back pain with  left-sided sciatica, Bronchitis, chronic (HCC), Chest pain, Chronic bronchitis (HCC), Chronic cough, DDD (degenerative disc disease), lumbar, Elevated troponin, GERD (gastroesophageal reflux disease), Hyperlipidemia, Hypertension, Insomnia, IPF (idiopathic pulmonary fibrosis) (HCC), Lumbar facet arthropathy, Neuropathic pain, Non morbid obesity due to excess calories, Poor tolerance for activity, Sleep apnea, and Stroke (HCC).   reports that he quit smoking about 16 years ago. His smoking use included cigarettes. He has a 54.00 pack-year smoking history. He has never used smokeless tobacco.  Past Surgical History:  Procedure Laterality Date  . triple bypass surgery      Allergies  Allergen Reactions  . Penicillins Shortness Of Breath, Swelling and Other (See Comments)    Has patient had a PCN reaction causing immediate rash, facial/tongue/throat swelling, SOB or lightheadedness with hypotension: No Has patient had a PCN reaction causing severe rash involving mucus membranes or skin necrosis: No Has patient had a PCN reaction that required hospitalization: Yes Has patient had a PCN reaction occurring within the last 10 years: No If all of the above answers are "NO", then may proceed with Cephalosporin use.   . Benadryl [Diphenhydramine] Hives    Immunization History  Administered Date(s) Administered  . Hepatitis B, ped/adol 04/20/1991, 05/18/1991, 11/04/1991  . Influenza Split 09/30/2004  . Pneumococcal Conjugate-13 10/04/2013  . Pneumococcal Polysaccharide-23 10/04/2013, 01/31/2016    Family History  Problem Relation Age of Onset  . Alzheimer's disease Mother   . Leukemia Father   . Lung cancer Sister      Current Outpatient Medications:  .  albuterol (PROVENTIL HFA;VENTOLIN HFA) 108 (90 Base) MCG/ACT inhaler, Inhale 2 puffs into the lungs 2 (two) times daily., Disp: 18 g, Rfl: 2 .  aspirin EC 325 MG tablet, Take 325 mg by mouth daily. , Disp: , Rfl:  .  benzonatate  (TESSALON) 100 MG capsule, Take 100 mg by mouth every 8 hours as needed for cough, Disp: 20 capsule, Rfl: 0 .  budesonide-formoterol (SYMBICORT) 80-4.5 MCG/ACT inhaler, Inhale 2 puffs into the lungs 2 (two) times daily., Disp: 1 Inhaler, Rfl: 2 .  Cholecalciferol 1.25 MG (50000 UT) TABS, Take by mouth., Disp: , Rfl:  .  cyclobenzaprine (FLEXERIL) 10 MG tablet, Take 10 mg by mouth 3 (three) times daily as needed for muscle spasms. , Disp: , Rfl:  .  diclofenac sodium (VOLTAREN) 1 % GEL, Apply topically., Disp: , Rfl:  .    docusate sodium (COLACE) 100 MG capsule, Take 100 mg by mouth 2 (two) times daily., Disp: , Rfl: 5 .  DULoxetine (CYMBALTA) 30 MG capsule, TAKE 2 CAPSULES BY MOUTH EVERY DAY AT BEDTIME, Disp: , Rfl: 2 .  esomeprazole (NEXIUM) 40 MG capsule, Take 40 mg by mouth., Disp: , Rfl:  .  furosemide (LASIX) 40 MG tablet, Take 40 mg by mouth daily as needed for fluid. , Disp: , Rfl:  .  gabapentin (NEURONTIN) 400 MG capsule, Take 400 mg by mouth 3 (three) times daily. , Disp: , Rfl:  .  Insulin Pen Needle (FIFTY50 PEN NEEDLES) 32G X 4 MM MISC, 1 Device by Misc.(Non-Drug; Combo Route) route daily., Disp: , Rfl:  .  Insulin Pen Needle 32G X 4 MM MISC, 1 Device by Misc.(Non-Drug; Combo Route) route daily., Disp: , Rfl:  .  isosorbide mononitrate (IMDUR) 30 MG 24 hr tablet, Take 30 mg by mouth daily. , Disp: , Rfl:  .  ketoconazole (NIZORAL) 2 % cream, APPLY TO AFFECTED RED, FLAKY AREAS ON THE FACE TWICE A DAY, Disp: , Rfl: 2 .  ketoconazole (NIZORAL) 2 % shampoo, Apply 1 application topically every other day. , Disp: , Rfl:  .  lamoTRIgine (LAMICTAL) 25 MG tablet, Take 25 mg by mouth daily. , Disp: , Rfl:  .  methocarbamol (ROBAXIN) 500 MG tablet, Take 500 mg by mouth daily. , Disp: , Rfl:  .  metoprolol succinate (TOPROL-XL) 50 MG 24 hr tablet, Take 50 mg by mouth 2 (two) times daily. , Disp: , Rfl:  .  morphine (MSIR) 30 MG tablet, Take by mouth., Disp: , Rfl:  .  nitroGLYCERIN (NITROSTAT) 0.4  MG SL tablet, Place 0.4 mg under the tongue every 5 (five) minutes as needed for chest pain. , Disp: , Rfl:  .  oxyCODONE-acetaminophen (PERCOCET) 10-325 MG tablet, TAKE 1 TABLET BY MOUTH EVERY 12 (TWELVE) HOURS AS NEEDED FOR UP TO 14 DAYS., Disp: , Rfl: 0 .  polyethylene glycol (MIRALAX / GLYCOLAX) packet, Take 17 g by mouth daily as needed for moderate constipation. , Disp: , Rfl:  .  predniSONE (DELTASONE) 5 MG tablet, TAKE 1 TABLET BY MOUTH EVERY DAY WITH BREAKFAST, Disp: 60 tablet, Rfl: 2 .  ranolazine (RANEXA) 500 MG 12 hr tablet, Take 500 mg by mouth 2 (two) times daily. , Disp: , Rfl:  .  rosuvastatin (CRESTOR) 20 MG tablet, Take 20 mg by mouth daily. , Disp: , Rfl:  .  Teriparatide, Recombinant, 600 MCG/2.4ML SOLN, Inject into the skin., Disp: , Rfl:       Objective:   Vitals:   09/08/18 1454  BP: 118/60  Pulse: 81  SpO2: 92%  Weight: 209 lb 9.6 oz (95.1 kg)  Height: 5' 10" (1.778 m)    Estimated body mass index is 30.07 kg/m as calculated from the following:   Height as of this encounter: 5' 10" (1.778 m).   Weight as of this encounter: 209 lb 9.6 oz (95.1 kg).  _0 @  Filed Weights   09/08/18 1454  Weight: 209 lb 9.6 oz (95.1 kg)     Physical Exam  General Appearance:    Alert, cooperative, no distress, appears stated age - yes , Deconditioned looking - mild , OBESE  - yes, Sitting on Wheelchair -  no  Head:    Normocephalic, without obvious abnormality, atraumatic  Eyes:    PERRL, conjunctiva/corneas clear,  Ears:    Normal TM's and external ear canals, both ears  Nose:   Nares normal, septum midline, mucosa normal, no drainage    or sinus tenderness. OXYGEN ON  - no . Patient is @ ra   Throat:   Lips, mucosa, and tongue normal; teeth and gums normal. Cyanosis on lips - no  Neck:   Supple, symmetrical, trachea midline, no adenopathy;    thyroid:  no enlargement/tenderness/nodules; no carotid   bruit or JVD  Back:     Symmetric, no curvature, ROM  normal, no CVA tenderness  Lungs:     Distress - no , Wheeze no, Barrell Chest - no, Purse lip breathing - no, Crackles - some scattered. Not classic velcro   Chest Wall:    No tenderness or deformity.    Heart:    Regular rate and rhythm, S1 and S2 normal, no rub   or gallop, Murmur - no  Breast Exam:    NOT DONE  Abdomen:     Soft, non-tender, bowel sounds active all four quadrants,    no masses, no organomegaly. Visceral obesity - yes  Genitalia:   NOT DONE  Rectal:   NOT DONE  Extremities:   Extremities - normal, Has Cane - no, Clubbing -yes, Edema - no  Pulses:   2+ and symmetric all extremities  Skin:   Stigmata of Connective Tissue Disease - no  Lymph nodes:   Cervical, supraclavicular, and axillary nodes normal  Psychiatric:  Neurologic:   Pleasant - yes, Anxious - no, Flat affect - yes  CAm-ICU - neg, Alert and Oriented x 3 - yes, Moves all 4s - yes, Speech - normal, Cognition - intact           Assessment:       ICD-10-CM   1. ILD (interstitial lung disease) (HCC) J84.9   2. Mold exposure Z77.120   3. Acute bronchitis, unspecified organism J20.9        Plan:     Patient Instructions     ICD-10-CM   1. ILD (interstitial lung disease) (HCC) J84.9   2. Mold exposure Z77.120   3. Acute bronchitis, unspecified organism J20.9     Stable since early part of the year  Plan  - take ofev booklet with you (if we have it)   - do spirometry and dlco in 4-6 months  - do HRCT followup in 4-6 months (will be a 1 yuear followup) - continue to avoid mold  -Please take Take prednisone 40mg once daily x 3 days, then 30mg once daily x 3 days, then 20mg once daily x 3 days, then prednisone 10mg once daily  x 3 days and  Then 5mg daily to continue  - Take doxycycline 100mg po twice daily x 5 days; take after meals and avoid sunlight    Followup  - depending on course and results of above, can discuss ofev as an option  -   > 50% of this > 25 min visit spent in face to  face counseling or coordination of care - by this undersigned MD - Dr Murali Ramaswamy. This includes one or more of the following documented above: discussion of test results, diagnostic or treatment recommendations, prognosis, risks and benefits of management options, instructions, education, compliance or risk-factor reduction    SIGNATURE    Dr. Murali Ramaswamy, M.D., F.C.C.P,  Pulmonary and Critical Care Medicine Staff Physician, [facility] Director - Interstitial Lung Disease  Program  [facility] [place], [place], [address]    Pager: [phone], If no answer or between  15:00h - 7:00h: call [phone] Telephone: [phone]  4:32 PM 09/08/2018

## 2018-09-08 NOTE — Patient Instructions (Addendum)
ICD-10-CM   1. ILD (interstitial lung disease) (HCC) J84.9   2. Mold exposure Z77.120   3. Acute bronchitis, unspecified organism J20.9     Stable since early part of the year  Plan  - take ofev booklet with you (if we have it)   - do spirometry and dlco in 4-6 months  - do HRCT followup in 4-6 months (will be a 1 yuear followup) - continue to avoid mold  -Please take Take prednisone 40mg  once daily x 3 days, then 30mg  once daily x 3 days, then 20mg  once daily x 3 days, then prednisone 10mg  once daily  x 3 days and  Then 5mg  daily to continue  - Take doxycycline 100mg  po twice daily x 5 days; take after meals and avoid sunlight    Followup  - depending on course and results of above, can discuss ofev as an option  -

## 2018-09-24 ENCOUNTER — Inpatient Hospital Stay: Admission: RE | Admit: 2018-09-24 | Payer: Medicare HMO | Source: Ambulatory Visit

## 2018-11-11 IMAGING — CT CT OUTSIDE FILMS CHEST
2 of 5 series · 15 of 36 positions shown, 18 images · non-contrast
Comparison: none

[Series 3: axial lung 1.5 mm · axial · 0.71mm/px · z∈[+1145,+1370]mm · 12 of 366 slices shown, 15 images]
[im 22/366  mediastinal]
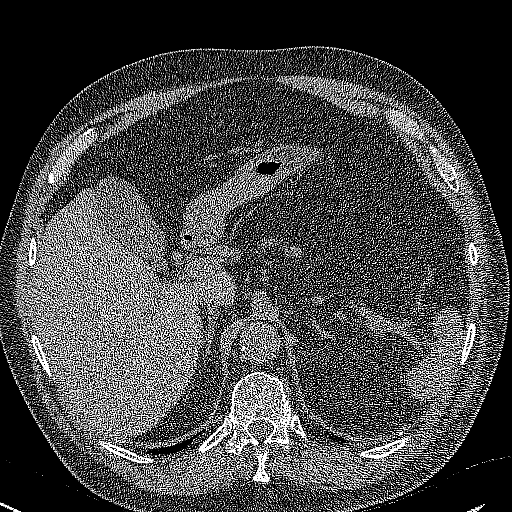
[im 22/366  lung]
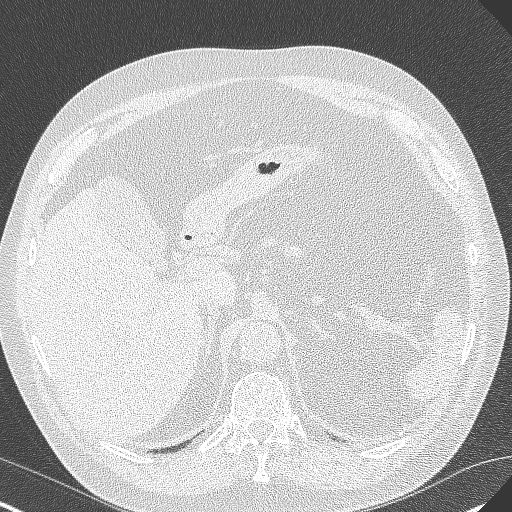
[im 65/366  lung]
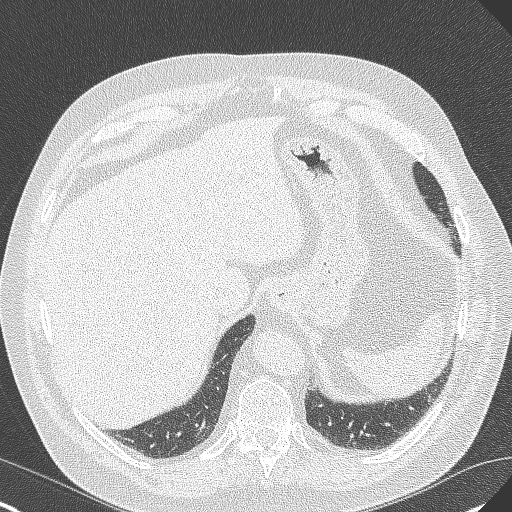
[im 86/366  lung]
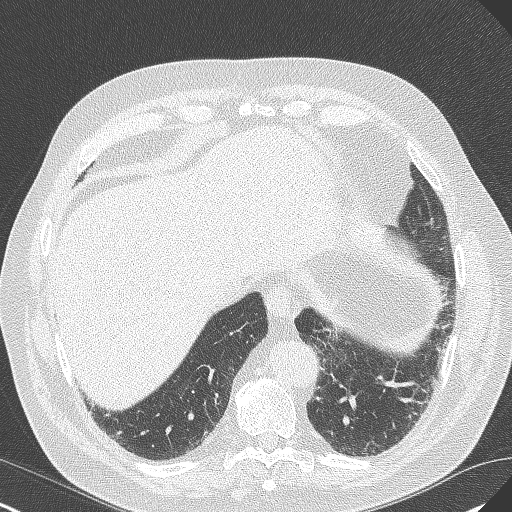
[im 108/366  lung]
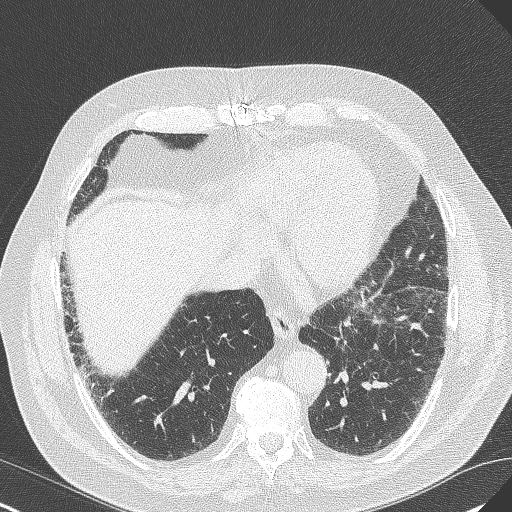
[im 151/366  mediastinal]
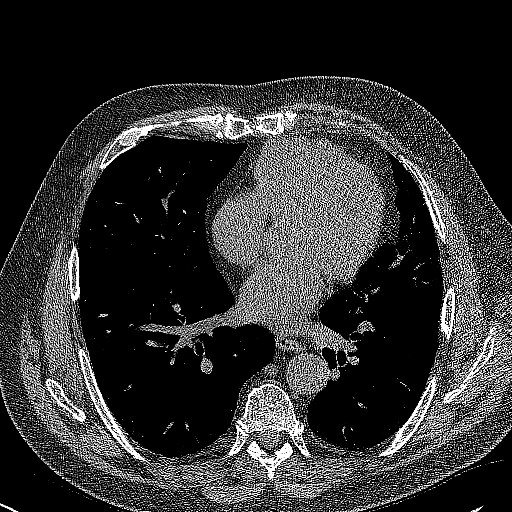
[im 151/366  lung]
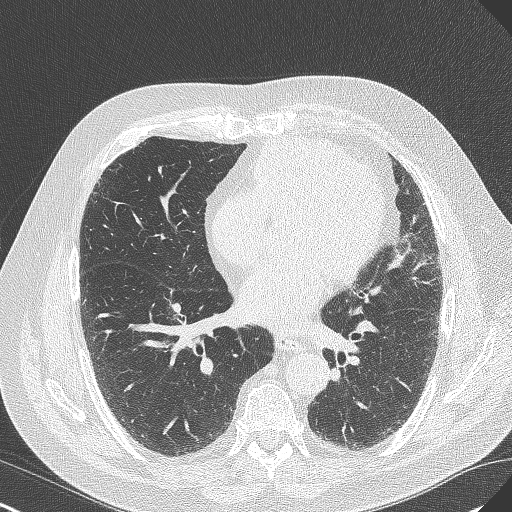
[im 172/366  lung]
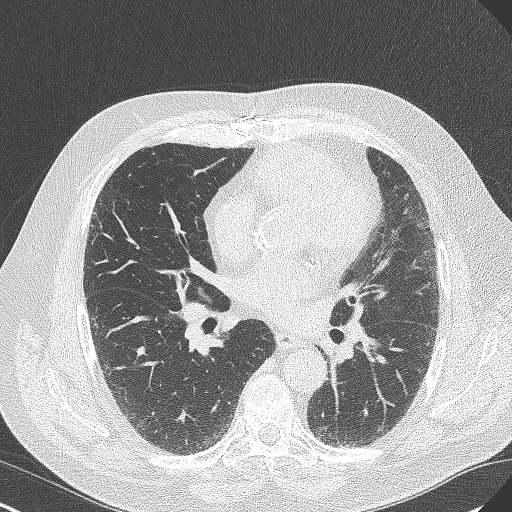
[im 194/366  lung]
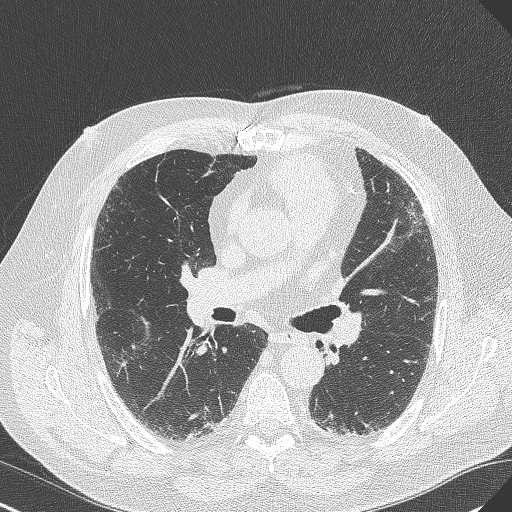
[im 237/366  lung]
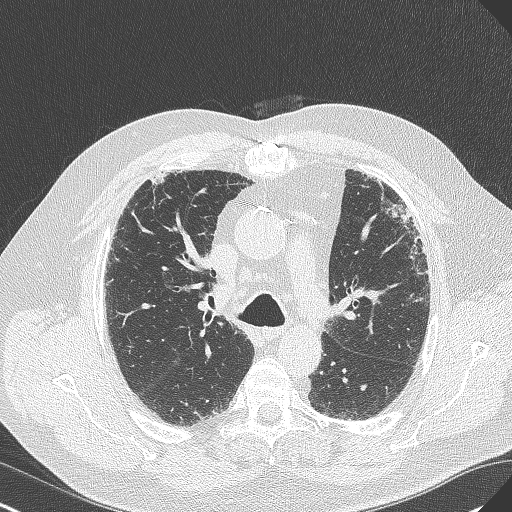
[im 258/366  mediastinal]
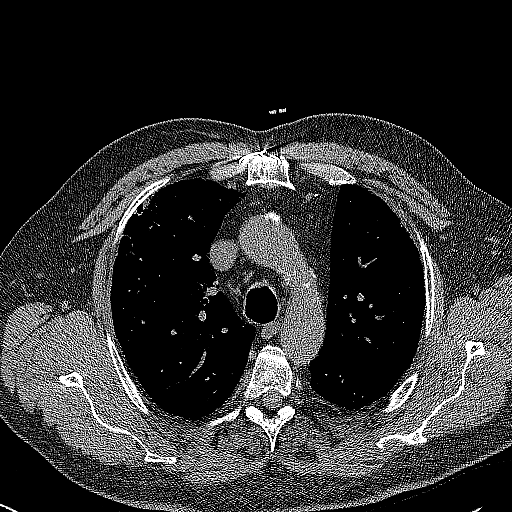
[im 258/366  lung]
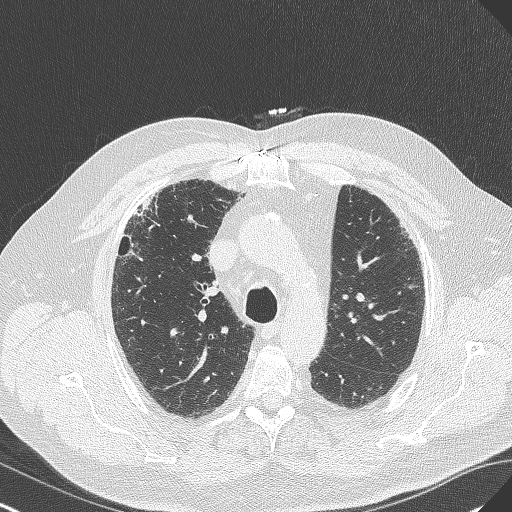
[im 280/366  lung]
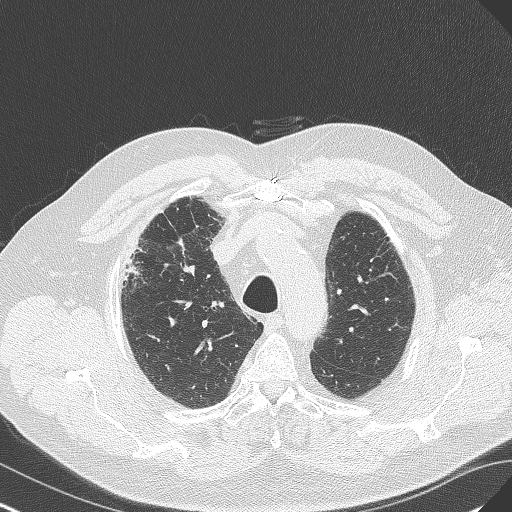
[im 323/366  lung]
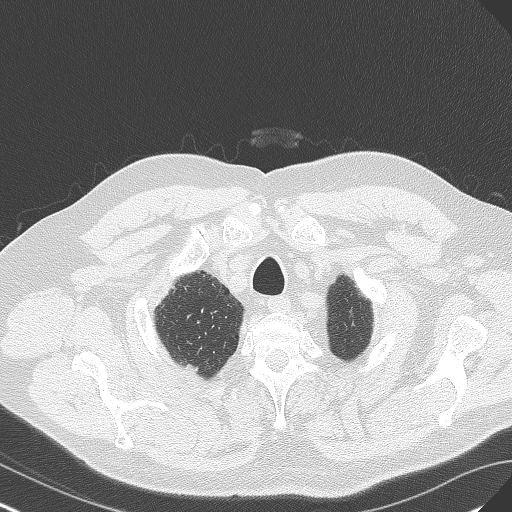
[im 344/366  lung]
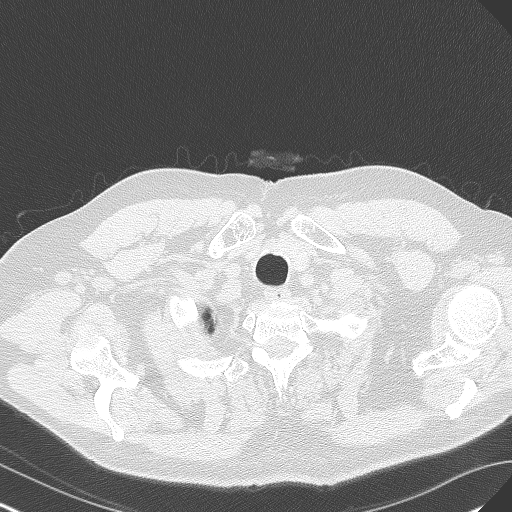

[Series 4: coronal mpr · coronal · 0.62mm/px · 3 of 113 slices shown]
[im 23/113  lung]
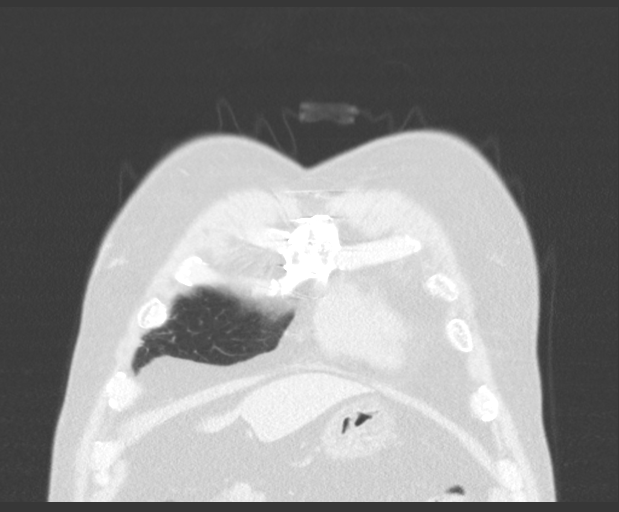
[im 45/113  lung]
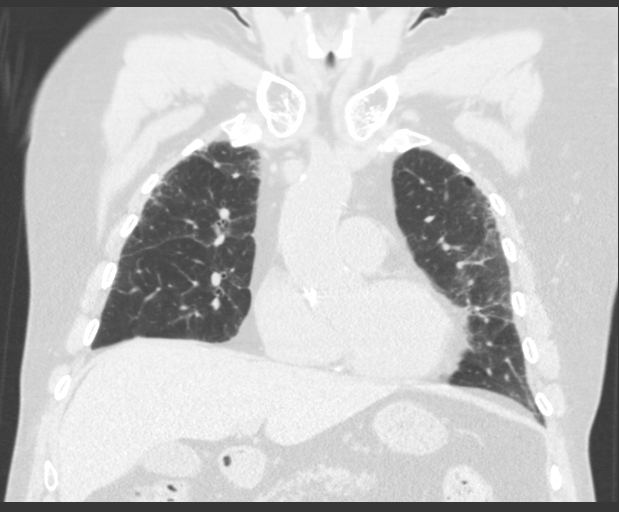
[im 68/113  lung]
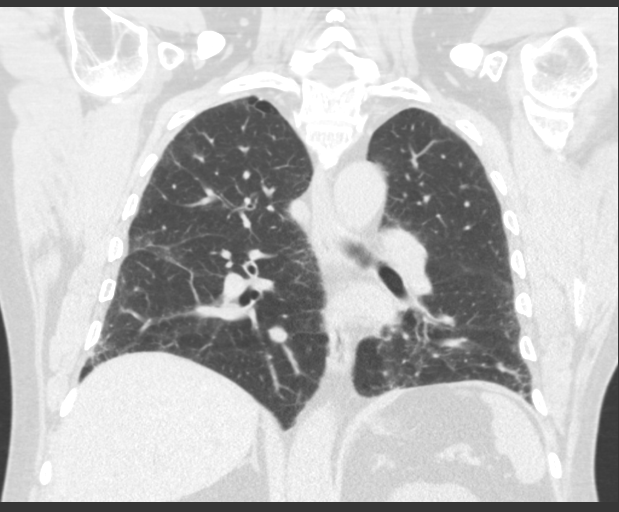

[15 of 36 positions shown; findings below may reference images not displayed]

Canned report from images found in remote index.

Refer to host system for actual result text.

## 2018-11-29 DEATH — deceased

## 2018-12-21 ENCOUNTER — Telehealth: Payer: Self-pay | Admitting: Internal Medicine

## 2018-12-21 NOTE — Telephone Encounter (Signed)
Received urgent staff message from Burna Sis needing to know if pt's CT which is currently scheduled for 4/10 is urgent or if it can be rescheduled.  Pt also has an OV scheduled for 4/14.  MR, please advise on this. Thanks!

## 2018-12-22 NOTE — Telephone Encounter (Signed)
I have notified Burna Sis that pt's CT can be rescheduled until September.   Attempted to call pt to let him know that we are going to reschedule his appt from current scheduled appt of 4/14 with PFT prior until September but unable to reach pt. I am going to cancel pt's OV and place a recall in pt's chart. If pt calls back, we can let him know this.

## 2018-12-22 NOTE — Telephone Encounter (Signed)
Move spriometry/d;lco and HRCT to sept 2020 becaue of pandemic

## 2018-12-23 NOTE — Telephone Encounter (Signed)
Patient's daughter called to let us know that he has passed away.

## 2018-12-23 NOTE — Telephone Encounter (Signed)
Really sad. How did it happen? Can you get condolence card please?

## 2018-12-23 NOTE — Telephone Encounter (Signed)
Noted  

## 2019-01-08 ENCOUNTER — Other Ambulatory Visit: Payer: Medicare HMO

## 2019-01-08 ENCOUNTER — Encounter

## 2019-01-12 ENCOUNTER — Ambulatory Visit: Payer: Medicare HMO | Admitting: Internal Medicine
